# Patient Record
Sex: Female | Born: 1990 | Race: White | Hispanic: No | Smoking: Never smoker
Health system: Southern US, Community
[De-identification: ages and names within clinical notes are randomized; demographics above are authoritative.]

## PROBLEM LIST (undated history)

## (undated) DIAGNOSIS — J4530 Mild persistent asthma, uncomplicated: Secondary | ICD-10-CM

## (undated) DIAGNOSIS — F909 Attention-deficit hyperactivity disorder, unspecified type: Secondary | ICD-10-CM

## (undated) DIAGNOSIS — I4510 Unspecified right bundle-branch block: Secondary | ICD-10-CM

## (undated) HISTORY — PX: CHEST TUBE INSERTION: SHX231

## (undated) HISTORY — PX: WRIST SURGERY: SHX841

## (undated) HISTORY — DX: Attention-deficit hyperactivity disorder, unspecified type: F90.9

## (undated) HISTORY — DX: Mild persistent asthma, uncomplicated: J45.30

---
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
## 2011-03-21 ENCOUNTER — Emergency Department (HOSPITAL_COMMUNITY): Payer: BC Managed Care – PPO

## 2011-03-21 ENCOUNTER — Emergency Department (HOSPITAL_COMMUNITY)
Admission: EM | Admit: 2011-03-21 | Discharge: 2011-03-21 | Disposition: A | Discharge status: Home / Self Care | Payer: BC Managed Care – PPO | Attending: Emergency Medicine | Admitting: Emergency Medicine

## 2011-03-21 DIAGNOSIS — R0602 Shortness of breath: Secondary | ICD-10-CM | POA: Insufficient documentation

## 2011-03-21 DIAGNOSIS — R079 Chest pain, unspecified: Secondary | ICD-10-CM | POA: Insufficient documentation

## 2011-03-21 DIAGNOSIS — J9383 Other pneumothorax: Secondary | ICD-10-CM | POA: Insufficient documentation

## 2011-04-16 ENCOUNTER — Encounter: Payer: Self-pay | Admitting: Pulmonary Disease

## 2011-04-17 ENCOUNTER — Telehealth: Payer: Self-pay | Admitting: Pulmonary Disease

## 2011-04-17 ENCOUNTER — Ambulatory Visit (INDEPENDENT_AMBULATORY_CARE_PROVIDER_SITE_OTHER)
Admission: RE | Admit: 2011-04-17 | Discharge: 2011-04-17 | Disposition: A | Discharge status: Home / Self Care | Payer: BC Managed Care – PPO | Source: Ambulatory Visit | Attending: Pulmonary Disease | Admitting: Pulmonary Disease

## 2011-04-17 ENCOUNTER — Ambulatory Visit (INDEPENDENT_AMBULATORY_CARE_PROVIDER_SITE_OTHER): Payer: BC Managed Care – PPO | Admitting: Pulmonary Disease

## 2011-04-17 ENCOUNTER — Encounter: Payer: Self-pay | Admitting: Pulmonary Disease

## 2011-04-17 DIAGNOSIS — J9383 Other pneumothorax: Secondary | ICD-10-CM | POA: Insufficient documentation

## 2011-04-17 NOTE — Patient Instructions (Signed)
Will check scan of chest looking for blebs and other abnormalities. Will call to schedule bloodwork and breathing tests on a Friday afternoon.

## 2011-04-17 NOTE — Assessment & Plan Note (Signed)
The patient has had a recent pneumothorax, and I suspect her initial right-sided chest pain was related as well.  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  It raises the question whether her asthma is adequately controlled.  Also need to consider whether she may have an alpha one antitrypsin deficiency, or another unusual lung disease such as eosinophilic granuloma or lymphangioleiomyomatosis.  I would like to schedule for a CT of her chest, full PFTs, and check her alpha one level.  We'll then be able to give her some idea as to why this occurred, and possibly her risk for further events.

## 2011-04-17 NOTE — Progress Notes (Signed)
  Subjective:    Patient ID: Alison Young, female    DOB: 1991-01-20, 20 y.o.   MRN: [id number]  HPI The patient is an 20 year old female who I've been asked to see for recurrent pneumothorax.  She has a known history of asthma, and approximately 10 weeks ago had the sudden onset of right-sided chest and shoulder pain.  She presented to an urgent care, and a chest x-ray there was "clear".  Her pain resolved after 24 hours, and approximate 5 weeks later while at school at [employer], she had another episode of sudden onset of right-sided chest pain.  She presented to a local hospital, where she was found to have a right pneumothorax.  A small chest tube was placed, with resolution.  Her tube was removed the end of August.  The patient has a history of lifelong asthma, and in the past has been on Advair and other inhalers.  She admits to medical noncompliance, and most recently has been on Singulair with as needed albuterol.  She also takes allergy vaccine.  The patient does not have asthma issues unless she gets sick, and tells me that she rarely uses her rescue inhaler.  There is no history of premature lung disease in her family.  The patient denies any activities which will increase intrathoracic pressure, and has not done anything that results in Valsalva.   Review of Systems  Constitutional: Negative for fever and unexpected weight change.  HENT: Negative for ear pain, nosebleeds, congestion, sore throat, rhinorrhea, sneezing, trouble swallowing, dental problem, postnasal drip and sinus pressure.   Eyes: Negative for redness and itching.  Respiratory: Positive for shortness of breath. Negative for cough, chest tightness and wheezing.   Cardiovascular: Positive for chest pain. Negative for palpitations and leg swelling.  Gastrointestinal: Negative for nausea and vomiting.  Genitourinary: Negative for dysuria.  Musculoskeletal: Negative for joint swelling.  Skin: Negative for rash.    Neurological: Negative for headaches.  Hematological: Does not bruise/bleed easily.  Psychiatric/Behavioral: Negative for dysphoric mood. The patient is not nervous/anxious.        Objective:   Physical Exam Constitutional:  Well developed, no acute distress  HENT:  Nares patent without discharge  Oropharynx without exudate, palate and uvula are normal  Eyes:  Perrla, eomi, no scleral icterus  Neck:  No JVD, no TMG  Cardiovascular:  Normal rate, regular rhythm, no rubs or gallops.  No murmurs        Intact distal pulses  Pulmonary :  Normal breath sounds, no stridor or respiratory distress   No rales, rhonchi, or wheezing  Abdominal:  Soft, nondistended, bowel sounds present.  No tenderness noted.   Musculoskeletal:  No lower extremity edema noted.  Lymph Nodes:  No cervical lymphadenopathy noted  Skin:  No cyanosis noted  Neurologic:  Alert, appropriate, moves all 4 extremities without obvious deficit.         Assessment & Plan:

## 2011-04-17 NOTE — Telephone Encounter (Signed)
Alison Young we need to call this pt and find out what Friday afternoon she can come into town and have full pfts and AAT Phenotyping. Thanks.

## 2011-04-20 NOTE — Telephone Encounter (Signed)
Does she need ov with you same day as the PFT?

## 2011-04-20 NOTE — Telephone Encounter (Signed)
She does not.  Thanks.  I will call her. Make sure she gets the AAT PHENOTYPING as well. Thanks.

## 2011-04-21 NOTE — Telephone Encounter (Signed)
Pt's mother, Harriett Sine,  returned Megan's call & can be reached at [phone].  Pt's Fall break is from 13OCT-20OCT. Antionette Fairy

## 2011-04-21 NOTE — Telephone Encounter (Signed)
[id number] 

## 2011-04-22 NOTE — Telephone Encounter (Signed)
PFT SCHEDULED- PT'S MOM WAS GIVEN VERBAL INSTRUCTIONS TO PASS ALONG TO PT RE: PFT. ALSO ADVISED TO ARRIVE IN OUR OFFICE AT LEAST 15 MINS. PRIOR - PER MEGAN LABS WILL BE PUT IN. Alison Young

## 2011-04-22 NOTE — Telephone Encounter (Signed)
[id number] 

## 2011-04-22 NOTE — Telephone Encounter (Signed)
Pt scheduled for 10/15 at 4 pm.  Also put in labs for that day as well.

## 2011-05-04 ENCOUNTER — Other Ambulatory Visit: Payer: BC Managed Care – PPO

## 2011-05-04 ENCOUNTER — Ambulatory Visit (INDEPENDENT_AMBULATORY_CARE_PROVIDER_SITE_OTHER): Payer: BC Managed Care – PPO | Admitting: Pulmonary Disease

## 2011-05-04 DIAGNOSIS — J9383 Other pneumothorax: Secondary | ICD-10-CM

## 2011-05-04 LAB — PULMONARY FUNCTION TEST

## 2011-05-04 NOTE — Progress Notes (Signed)
PFT done today. 

## 2011-05-08 ENCOUNTER — Telehealth: Payer: Self-pay | Admitting: Pulmonary Disease

## 2011-05-08 NOTE — Telephone Encounter (Signed)
Pt requesting results on PFT and labs. Also needs the CD's that she forgot at last OV 04-17-11. Pt is aware KC and Aundra Millet R are out of the office and if she has not heard back by mid Monday morning to give [place] a return call. Pt is leaving for school on Sunday and mother will have to pick up same. Megan please advise. Thanks.

## 2011-05-09 LAB — ALPHA-1 ANTITRYPSIN PHENOTYPE

## 2011-05-11 NOTE — Telephone Encounter (Signed)
Called and spoke with pt.  Informed her KC out of office this week and will return next week.  Informed pt I will check with KC once he gets back to see if results of PFT and A1AT are back yet.  Pt verbalized understanding and stated she was ok with this.

## 2011-05-11 NOTE — Telephone Encounter (Signed)
Pt's mother called & stated pt is back in school.   Requesting results from testing completed & requesting discs pt gave to [facility] be returned.  Antionette Fairy    Pt's mother can be reached at [phone] or at [phone].  Antionette Fairy

## 2011-05-11 NOTE — Telephone Encounter (Signed)
Spoke with pt's mother and notified that [facility] out of the office this wk and this will be addressed next wk when he returns. She verbalized understanding.

## 2011-05-18 NOTE — Telephone Encounter (Signed)
Discussed all test results with pt's mother.  Answered all of her questions.  Pt needs yearly spirometry if she stays on singulair alone.  Can f/u with me when she gets home from school in the summers.

## 2011-05-20 ENCOUNTER — Encounter: Payer: Self-pay | Admitting: Pulmonary Disease

## 2011-08-17 ENCOUNTER — Telehealth: Payer: Self-pay | Admitting: Pulmonary Disease

## 2011-08-17 NOTE — Telephone Encounter (Signed)
Spoke with Harriett Sine, pt's mother. She states that the pt is having "lung pain" just like before when her lung had collapsed. She states pt is at school in the [place] so unable to come here for ov. I advised that the pt will need to go to ED closest to her asap for eval. Harriett Sine verbalized understanding and will inform the pt.

## 2011-10-30 ENCOUNTER — Ambulatory Visit: Payer: BC Managed Care – PPO

## 2012-02-15 ENCOUNTER — Encounter: Payer: Self-pay | Admitting: Internal Medicine

## 2012-02-15 ENCOUNTER — Telehealth: Payer: Self-pay | Admitting: Internal Medicine

## 2012-02-15 ENCOUNTER — Ambulatory Visit (INDEPENDENT_AMBULATORY_CARE_PROVIDER_SITE_OTHER)
Admission: RE | Admit: 2012-02-15 | Discharge: 2012-02-15 | Disposition: A | Discharge status: Home / Self Care | Payer: BC Managed Care – PPO | Source: Ambulatory Visit | Attending: Internal Medicine | Admitting: Internal Medicine

## 2012-02-15 ENCOUNTER — Ambulatory Visit (INDEPENDENT_AMBULATORY_CARE_PROVIDER_SITE_OTHER): Payer: BC Managed Care – PPO | Admitting: Internal Medicine

## 2012-02-15 VITALS — BP 110/58 | HR 100 | Ht 66.5 in | Wt 124.0 lb

## 2012-02-15 DIAGNOSIS — Z8709 Personal history of other diseases of the respiratory system: Secondary | ICD-10-CM

## 2012-02-15 DIAGNOSIS — R0789 Other chest pain: Secondary | ICD-10-CM

## 2012-02-15 DIAGNOSIS — J9383 Other pneumothorax: Secondary | ICD-10-CM

## 2012-02-15 DIAGNOSIS — J45909 Unspecified asthma, uncomplicated: Secondary | ICD-10-CM

## 2012-02-15 NOTE — Progress Notes (Signed)
Subjective:    Patient ID: Alison Young, female    DOB: 1990-07-23, 21 y.o.   MRN: [id number]  HPI 04/17/11- Dr Shelle Iron The patient is an 21 year old female who I've been asked to see for recurrent pneumothorax.  She has a known history of asthma, and approximately 10 weeks ago had the sudden onset of right-sided chest and shoulder pain.  She presented to an urgent care, and a chest x-ray there was "clear".  Her pain resolved after 24 hours, and approximate 5 weeks later while at school at [employer], she had another episode of sudden onset of right-sided chest pain.  She presented to a local hospital, where she was found to have a right pneumothorax.  A small chest tube was placed, with resolution.  Her tube was removed the end of August.  The patient has a history of lifelong asthma, and in the past has been on Advair and other inhalers.  She admits to medical noncompliance, and most recently has been on Singulair with as needed albuterol.  She also takes allergy vaccine.  The patient does not have asthma issues unless she gets sick, and tells me that she rarely uses her rescue inhaler.  There is no history of premature lung disease in her family.  The patient denies any activities which will increase intrathoracic pressure, and has not done anything that results in Valsalva.  02/15/12- 38 yo F never smoker w/ hx recurrent PTX,, chronic asthma last seen by Dr Shelle Iron 04/17/11- Acute visit, here with mother. [place] Patient[place] started having SOB and fast heartrate-sat down for about 1 hour and then decided to wakeboard and started having pain in right side of chest-was unable to get deep breathe, fatigued, palpitations, nearly fainted, and weak (had collapsed lung back in August 2011 and again 2012/ chest tube. Describes current episode as sore upper anterior chest and mid anterior chest with pressure. She rested a few hours and then tried to wake board-pulling tow rope  but not diving. Sharp pain through left parasternal area to back lasted through the afternoon. Pain still present but less today. No diaphoresis, nausea, vomiting, dysphagia, headache, cough or wheeze. She did not seek medical attention until now. She restarted Dulera last week after seeing her allergist/Dr. Jordan Callas. Has not used rescue inhaler recently. CT chest 04/17/2011 was negative for blebs or parenchymal disease. Aunt has immotile cilia syndrome and bronchiectasis.  ROS-see HPI Constitutional:   No-   weight loss, night sweats, fevers, chills, fatigue, lassitude. HEENT:   No-  headaches, difficulty swallowing, tooth/dental problems, sore throat,       No-  sneezing, itching, ear ache, nasal congestion, post nasal drip,  CV:  + per HPI-  chest pain, No-orthopnea, PND, swelling in lower extremities, anasarca,  dizziness, palpitations Resp: + Pleuritic shortness of breath with exertion or at rest.              No-   productive cough,  No non-productive cough,  No- coughing up of blood.              No-   change in color of mucus.  No- wheezing.   Skin: No-   rash or lesions. GI:  No-   heartburn, indigestion, abdominal pain, nausea, vomiting, diarrhea,                 change in bowel habits, loss of appetite GU:  MS:  No-   joint pain or swelling.  Neuro-     nothing unusual Psych:  No- change in mood or affect. No depression or anxiety.  No memory loss.  OBJ- Physical Exam General- Alert, Oriented, Affect-appropriate, Distress- none acute; casual, comfortable appearing, apparently well [name] woman. Skin- rash-none, lesions- none, excoriation- none. No crepitus Lymphadenopathy- none Head- atraumatic            Eyes- Gross vision intact, PERRLA, conjunctivae and secretions clear            Ears- Hearing, canals-normal            Nose- Clear, no-Septal dev, mucus, polyps, erosion, perforation             Throat- Mallampati II , mucosa clear , drainage- none, tonsils- atrophic Neck-  flexible , trachea midline, no stridor , thyroid nl, carotid no bruit Chest - symmetrical excursion , unlabored           Heart/CV- RRR , no murmur , no gallop  , no rub, nl s1 s2                           - JVD- none , edema- none, stasis changes- none, varices- none           Lung- clear to P&A, wheeze- none, cough- none , dullness-none, rub- none           Chest wall- uncertain/minimal tenderness to pressure right upper anterior and right midaxillary line Abd- tender-no, distended-no, bowel sounds-present, HSM- no Br/ Gen/ Rectal- Not done, not indicated Extrem- cyanosis- none, clubbing, none, atrophy- none, strength- nl Neuro- grossly intact to observation       Assessment & Plan:

## 2012-02-15 NOTE — Telephone Encounter (Signed)
I reached her on her cell. CXR -no PTX, no explanation for chest pain.

## 2012-02-15 NOTE — Patient Instructions (Addendum)
Order- CXR atypical chest pain, asthma, hx pneumothorax  Screen for alpha 1 antitrypsin deficiency

## 2012-02-20 NOTE — Assessment & Plan Note (Signed)
I explained concern she did not seek attention for this pain and explained the potential seriousness if she were really having a recurrent pneumothorax. If no apparent pneumothorax on chest x-ray now, since she is markedly improved, there may not be more to do. Differential diagnosis would include pulled chest wall muscle or cracked rib, pneumothorax versus pneumomediastinum. Exam now is really unimpressive. Previous CT did not show blebs. We will have her return to Dr. Shelle Iron.

## 2012-02-21 ENCOUNTER — Encounter: Payer: Self-pay | Admitting: Pulmonary Disease

## 2012-03-14 ENCOUNTER — Encounter: Payer: Self-pay | Admitting: Internal Medicine

## 2012-03-16 ENCOUNTER — Encounter: Payer: Self-pay | Admitting: Internal Medicine

## 2012-03-18 ENCOUNTER — Ambulatory Visit: Payer: BC Managed Care – PPO | Admitting: Pulmonary Disease

## 2012-03-25 ENCOUNTER — Ambulatory Visit (INDEPENDENT_AMBULATORY_CARE_PROVIDER_SITE_OTHER): Payer: BC Managed Care – PPO | Admitting: Pulmonary Disease

## 2012-03-25 ENCOUNTER — Encounter: Payer: Self-pay | Admitting: Pulmonary Disease

## 2012-03-25 VITALS — BP 100/60 | HR 88 | Temp 98.3°F | Ht 66.5 in | Wt 123.0 lb

## 2012-03-25 DIAGNOSIS — J9383 Other pneumothorax: Secondary | ICD-10-CM

## 2012-03-25 NOTE — Patient Instructions (Addendum)
Continue on current meds. followup with me in one year, but please call if new symptoms.

## 2012-03-25 NOTE — Assessment & Plan Note (Signed)
The pt has not had further issues with chest pain since July.  Her cxr at that time did not show PTX.  She is being treated aggressively for her asthma and allergies by Dr. Barnhill Callas, and her medical compliance is very important to reduce her risk of recurrent ptx.  I have asked her to f/u with me in one year, but to call us if any issue with chest pain.

## 2012-03-25 NOTE — Progress Notes (Signed)
  Subjective:    Patient ID: Alison Young, female    DOB: 1990/08/30, 21 y.o.   MRN: [id number]  HPI The patient comes in today for followup of her history of pneumothorax.  She has been evaluated with a normal CT chest, and also has a normal alpha one antitrypsin level.  She has been doing well, but did have an episode of chest pain in July of this year that turned out to be unrelated to pneumothorax.  She has had no further chest discomfort since that time.  She also has a history of asthma, and was unable to stay controlled on Singulair alone.  This is being followed by her allergist, and she is now on dulera as well as other treatments for allergic rhinitis.   Review of Systems  Constitutional: Negative.  Negative for fever and unexpected weight change.  HENT: Positive for congestion, sore throat, sneezing, postnasal drip and sinus pressure. Negative for ear pain, nosebleeds, trouble swallowing and dental problem.   Eyes: Negative.  Negative for redness and itching.  Respiratory: Negative.  Negative for cough, chest tightness, shortness of breath and wheezing.   Cardiovascular: Negative.  Negative for palpitations and leg swelling.  Gastrointestinal: Negative.  Negative for nausea and vomiting.  Genitourinary: Negative.  Negative for dysuria.  Musculoskeletal: Negative.  Negative for joint swelling.  Skin: Negative.  Negative for rash.  Neurological: Negative.  Negative for headaches.  Hematological: Negative.  Does not bruise/bleed easily.  Psychiatric/Behavioral: Negative.  Negative for dysphoric mood. The patient is not nervous/anxious.        Objective:   Physical Exam Well-developed female in no acute distress Nose without purulence or discharge noted Chest totally clear to auscultation Cardiac exam with regular rate and rhythm Lower extremities without edema, no cyanosis Alert and oriented, moves all 4 extremities.       Assessment & Plan:

## 2012-07-26 ENCOUNTER — Emergency Department (HOSPITAL_COMMUNITY)
Admission: EM | Admit: 2012-07-26 | Discharge: 2012-07-27 | Disposition: A | Discharge status: Home / Self Care | Payer: BC Managed Care – PPO | Attending: Emergency Medicine | Admitting: Emergency Medicine

## 2012-07-26 ENCOUNTER — Telehealth: Payer: Self-pay | Admitting: Critical Care Medicine

## 2012-07-26 ENCOUNTER — Emergency Department (HOSPITAL_COMMUNITY): Payer: BC Managed Care – PPO

## 2012-07-26 ENCOUNTER — Encounter (HOSPITAL_COMMUNITY): Payer: Self-pay

## 2012-07-26 DIAGNOSIS — M549 Dorsalgia, unspecified: Secondary | ICD-10-CM | POA: Insufficient documentation

## 2012-07-26 DIAGNOSIS — J939 Pneumothorax, unspecified: Secondary | ICD-10-CM

## 2012-07-26 DIAGNOSIS — Z7982 Long term (current) use of aspirin: Secondary | ICD-10-CM | POA: Insufficient documentation

## 2012-07-26 DIAGNOSIS — Z8709 Personal history of other diseases of the respiratory system: Secondary | ICD-10-CM | POA: Insufficient documentation

## 2012-07-26 DIAGNOSIS — Z79899 Other long term (current) drug therapy: Secondary | ICD-10-CM | POA: Insufficient documentation

## 2012-07-26 DIAGNOSIS — J45909 Unspecified asthma, uncomplicated: Secondary | ICD-10-CM | POA: Insufficient documentation

## 2012-07-26 DIAGNOSIS — R0602 Shortness of breath: Secondary | ICD-10-CM | POA: Insufficient documentation

## 2012-07-26 DIAGNOSIS — J9383 Other pneumothorax: Secondary | ICD-10-CM | POA: Insufficient documentation

## 2012-07-26 LAB — PREGNANCY, URINE: Preg Test, Ur: NEGATIVE

## 2012-07-26 MED ORDER — MORPHINE SULFATE 4 MG/ML IJ SOLN
2.0000 mg | Freq: Once | INTRAMUSCULAR | Status: AC
  Administered 2012-07-26: 2 mg via INTRAVENOUS
  Filled 2012-07-26: qty 1

## 2012-07-26 MED ORDER — OXYCODONE-ACETAMINOPHEN 5-325 MG PO TABS: 1.0000 | ORAL_TABLET | Freq: Four times a day (QID) | ORAL | Status: DC | PRN

## 2012-07-26 NOTE — ED Provider Notes (Signed)
History     CSN: [id number]  Arrival date & time 07/26/12  1849   First MD Initiated Contact with Patient 07/26/12 1929      Chief Complaint  Patient presents with  . Shortness of Breath  . Chest Pain  . Back Pain    (Consider location/radiation/quality/duration/timing/severity/associated sxs/prior treatment) HPI Comments: [name] presents with her mother for evaluation.  She reports participating in a Crossfit class this afternoon.  She experienced sudden onset right-sided chest pain and shortness of breath.  She denies any falls, trauma, or chest wall injury.  She is concerned her lung may have collapsed again.  Patient is a 22 y.o. female presenting with shortness of breath, chest pain, and back pain. The history is provided by the patient. No language interpreter was used.  Shortness of Breath  The current episode started today. The problem occurs continuously. The problem has been unchanged. The problem is moderate. The symptoms are relieved by rest. Exacerbated by: deep breaths. Associated symptoms include chest pain and shortness of breath. Pertinent negatives include no chest pressure, no fever, no rhinorrhea, no stridor, no cough and no wheezing. There was no intake of a foreign body. The intake occurred while playing (while exercising). She was not exposed to toxic fumes. She has not inhaled smoke recently. She is currently using steroids. She has had prior hospitalizations. She has had no prior ICU admissions. She has had no prior intubations. Her past medical history is significant for asthma. Past medical history comments: pneumothorax, spontaneous. She has been behaving normally. Urine output has been normal. The last void occurred less than 6 hours ago. There were no sick contacts. She has received no recent medical care.  Chest Pain Primary symptoms include shortness of breath. Pertinent negatives for primary symptoms include no fever, no cough and no wheezing.  The  patient's medical history is significant for asthma. Past medical history comments: pneumothorax, spontaneous    Back Pain  Associated symptoms include chest pain. Pertinent negatives include no fever.    Past Medical History  Diagnosis Date  . Asthma   . Pneumothorax   . Allergic rhinitis     Past Surgical History  Procedure Date  . Wrist surgery     Left  . Chest tube insertion     Family History  Problem Relation Age of Onset  . Allergies Mother   . Allergies Father   . Allergies Sister   . Asthma Mother   . Asthma Sister   . Heart disease Mother     valve repair  . Breast cancer Maternal Grandmother   . Cervical cancer Maternal Grandmother     History  Substance Use Topics  . Smoking status: Never Smoker   . Smokeless tobacco: Never Used  . Alcohol Use: No    OB History    Grav Para Term Preterm Abortions TAB SAB Ect Mult Living                  Review of Systems  Constitutional: Negative for fever.  HENT: Negative for rhinorrhea.   Respiratory: Positive for shortness of breath. Negative for cough, wheezing and stridor.   Cardiovascular: Positive for chest pain.  Musculoskeletal: Positive for back pain.    Allergies  Review of patient's allergies indicates no known allergies.  Home Medications   Current Outpatient Rx  Name  Route  Sig  Dispense  Refill  . ALBUTEROL SULFATE HFA 108 (90 BASE) MCG/ACT IN AERS   Inhalation  Inhale 2 puffs into the lungs every 6 (six) hours as needed. SHORTNESS OF BREATH         . ASPIRIN-ACETAMINOPHEN-CAFFEINE 250-250-65 MG PO TABS   Oral   Take 1 tablet by mouth every 6 (six) hours as needed. HEADACHE         . LEVOCETIRIZINE DIHYDROCHLORIDE 5 MG PO TABS   Oral   Take 5 mg by mouth daily.           . MOMETASONE FUROATE 50 MCG/ACT NA SUSP   Nasal   Place 2 sprays into the nose daily.         . MOMETASONE FURO-FORMOTEROL FUM 100-5 MCG/ACT IN AERO   Inhalation   Inhale 2 puffs into the lungs 2  (two) times daily.         Marland Kitchen MONTELUKAST SODIUM 10 MG PO TABS   Oral   Take 10 mg by mouth daily.           . NON FORMULARY   Oral   Take 1 tablet by mouth daily. [employer]  Take as directed.           BP 119/76  Pulse 82  Temp 98 F (36.7 C)  Resp 20  SpO2 99%  LMP 07/26/2012  Physical Exam  Nursing note and vitals reviewed. Constitutional: She is oriented to person, place, and time. She appears well-developed and well-nourished. No distress.  HENT:  Head: Normocephalic and atraumatic.  Right Ear: External ear normal.  Left Ear: External ear normal.  Mouth/Throat: Oropharynx is clear and moist. No oropharyngeal exudate.  Eyes: Conjunctivae normal are normal. Pupils are equal, round, and reactive to light. Right eye exhibits no discharge. Left eye exhibits no discharge. No scleral icterus.  Neck: Normal range of motion. Neck supple. No JVD present. No tracheal deviation present.  Cardiovascular: Normal rate, regular rhythm, normal heart sounds and intact distal pulses.  Exam reveals no gallop and no friction rub.   No murmur heard. Pulmonary/Chest: Effort normal and breath sounds normal. No stridor. No respiratory distress. She has no wheezes. She has no rales. She exhibits no tenderness.  Abdominal: Soft. Bowel sounds are normal. She exhibits no distension and no mass. There is no tenderness. There is no rebound and no guarding.  Musculoskeletal: Normal range of motion. She exhibits no edema and no tenderness.  Lymphadenopathy:    She has no cervical adenopathy.  Neurological: She is alert and oriented to person, place, and time. No cranial nerve deficit.  Skin: Skin is warm and dry. No rash noted. She is not diaphoretic. No erythema. No pallor.  Psychiatric: She has a normal mood and affect. Her behavior is normal.    ED Course  Procedures (including critical care time)   Labs Reviewed  PREGNANCY, URINE   Dg Chest 2 View  07/26/2012  *RADIOLOGY  REPORT*  Clinical Data: 22 year old female with shortness of breath and chest pain.  CHEST - 2 VIEW  Comparison: 02/15/2012 and prior chest radiographs dating back to 2009.  Findings: A small (5-10%) right apical pneumothorax is noted. The cardiomediastinal silhouette is unremarkable. The lungs are clear. There is no evidence of pulmonary edema, pleural effusion, airspace disease or pulmonary mass. No bony abnormalities are identified.  IMPRESSION: Small (5-10%) right apical pneumothorax.  Critical Value/emergent results were called by telephone at the time of interpretation on 07/26/2012 at 7:40 p.m. to Dr. Lorenso Courier, who verbally acknowledged these results.   Original Report Authenticated By: Harmon Pier, M.D.  No diagnosis found.   Date: 07/26/2012  Rate: 105 bpm  Rhythm: sinus tachycardia  QRS Axis: normal  Intervals: normal  ST/T Wave abnormalities: normal  Conduction Disutrbances:none  Narrative Interpretation:   Old EKG Reviewed: none available      MDM  Pt presents for evaluation of sudden onset CP and SOB.  She has a hx of a spontaneous pneumothorax.  She appears nontoxic, NAD.  Note nl respiratory effort.  Her breath sounds are symmetric.  The CXR performed during the triage process demonstrates a small right apical pneumothorax.  Discussed the x-ray with Dr. Si Gaul (radiologist).  He notes it to be 5-10% total volume.  Placed oxygen via [place] and a consult with the trauma surgery service.  As this is such a small ptx, will manage noninvasively.  Anticipate observation admission.  2348.  Pt stable, NAD.  Repeat CXR unchanged.  There has been no expansion of the PTX.  Pt appears comfortable.  Discussed her evaluation with the on-call provider for CT surgery.  She will follow-up with him closely.  Discussed indications for immediate return to the ER.  She and her parents demonstrate clear understanding.     Tobin Chad, MD 07/26/12 2350

## 2012-07-26 NOTE — ED Notes (Signed)
Pt states was doing "crossfit stuff" and started having shortness of breathing, weakness, and sharp pains in R chest radiating down to R side, states had a collapsed lung in 2012 and this felt the same way. Pt in no respiratory distress, 100% on RA.

## 2012-07-26 NOTE — Telephone Encounter (Signed)
Pt thinks she is having a pneumothorax again. She was told to go to the nearest ED for evaluation.

## 2012-07-26 NOTE — ED Notes (Signed)
Patient reports that she is having SOB, CP and right back pain that began today. Patient has a history of spontaneous collapsed lung 02/2011 with a chest tube.

## 2012-07-26 NOTE — ED Notes (Signed)
Patient transported to X-ray 

## 2012-07-26 NOTE — [facility] Counselor (Signed)
*  07/26/12 [name] requested physician notes. Info sent this am and this writer was told that medical team would need to review. Once reviewed this writer was told that their staff would call back regarding patients disposition.   Called back this evening to follow up with with disposition. This Clinical research associate was told that paperwork was not received after it was confirmed earlier today by [name] that paperwork was received.  [facility] is also requesting more information including the initial H&P and more physicians notes. Although all  these documents were already faxed; [facility] continues to ask for these same documents again and again. Writer will fax these documents prior to shift ending again.   [facility] is also requesting a documented reason as to why patient keeps falling here in the ED. Writer notified EDP-Dr. Lorenso Courier of [facility]'s request. Dr. Lorenso Courier stated that he has other important priorities at this time and "I will not be dealing with that". Dr. Lorenso Courier explains that he will have to look through the patients entire chart and he is unable to do so at this time. He will past this task to the morning EDP to complete. Writer will ask on-coming [facility] staff to follow up with the morning EDP in regards to documenting, "Why patient has issues with falls".  Consulted with case manager Kim regarding patients disposition and the difficulty related to getting patient placed on wait list. Selena Batten will contact the medical director for assistance in this matter.

## 2012-08-02 ENCOUNTER — Other Ambulatory Visit: Payer: Self-pay

## 2012-08-02 ENCOUNTER — Other Ambulatory Visit: Payer: Self-pay | Admitting: Cardiothoracic Surgery

## 2012-08-02 ENCOUNTER — Ambulatory Visit (INDEPENDENT_AMBULATORY_CARE_PROVIDER_SITE_OTHER): Payer: BC Managed Care – PPO | Admitting: Cardiothoracic Surgery

## 2012-08-02 ENCOUNTER — Encounter: Payer: Self-pay | Admitting: Cardiothoracic Surgery

## 2012-08-02 VITALS — BP 109/73 | HR 118 | Resp 18 | Ht 66.5 in | Wt 123.0 lb

## 2012-08-02 DIAGNOSIS — J9383 Other pneumothorax: Secondary | ICD-10-CM

## 2012-08-02 LAB — CBC
HCT: 38.3 % (ref 36.0–46.0)
Hemoglobin: 13.1 g/dL (ref 12.0–15.0)
MCH: 28.8 pg (ref 26.0–34.0)
MCHC: 34.2 g/dL (ref 30.0–36.0)
MCV: 84.2 fL (ref 78.0–100.0)
Platelets: 226 10*3/uL (ref 150–400)
RBC: 4.55 MIL/uL (ref 3.87–5.11)
RDW: 13.7 % (ref 11.5–15.5)
WBC: 4.8 10*3/uL (ref 4.0–10.5)

## 2012-08-02 NOTE — Progress Notes (Signed)
PCP is Provider Not In System Referring Provider is Powers, Jules Husbands, MD  Chief Complaint  Patient presents with  . Spontaneous Pneumothorax    F/U from ED visiit    HPI: 22 year old with history of right spontaneous pneumothorax. The last evaluated at the emergency department 07/26/2012 at which time she had a right 5-10% apical pneumothorax.. 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 After that episode she was evaluated by Dr. clamps who had a chest CT performed which was unremarkable. Alpha 1 anti trypsin level was normal. She has a history of asthma as child  and is followed by an allergist on Singulair and dulera inhaler. Her last episode of chest pain and apical pneumothorax associated with cross training exercises. She probably has had 4 episodes of vomiting his pneumothorax since 2012 one requiring a chest tube. She feels that probably 2 of the episodes were temporally related to her menstrual cycle. There is no family history of spontaneous pneumothorax. There's no history of endometriosis.  She is currently a Consulting civil engineer at [employer]. She is a nonsmoker. She denies any significant thoracic trauma. CT scan of the chest year and half ago shows no structural abnormalities. Related symptoms including some dizziness palpitations and some presyncope.   Past Medical History  Diagnosis Date  . Asthma   . Pneumothorax   . Allergic rhinitis     Past Surgical History  Procedure Date  . Wrist surgery     Left  . Chest tube insertion    right chest pigtail catheter placed at [facility] while she was in undergraduate at [employer] 2012  Family History  Problem Relation Age of Onset  . Allergies Mother   . Allergies Father   . Allergies Sister   . Asthma Mother   . Asthma  Sister   . Heart disease Mother     valve repair  . Breast cancer Maternal Grandmother   . Cervical cancer Maternal Grandmother     Social History History  Substance Use Topics  . Smoking status: Never Smoker   . Smokeless tobacco: Never Used  . Alcohol Use: No    Current Outpatient Prescriptions  Medication Sig Dispense Refill  . albuterol (PROVENTIL HFA;VENTOLIN HFA) 108 (90 BASE) MCG/ACT inhaler Inhale 2 puffs into the lungs every 6 (six) hours as needed. SHORTNESS OF BREATH      . aspirin-acetaminophen-caffeine (EXCEDRIN MIGRAINE) 250-250-65 MG per tablet Take 1 tablet by mouth every 6 (six) hours as needed. HEADACHE      . levocetirizine (XYZAL) 5 MG tablet Take 5 mg by mouth daily.        . mometasone (NASONEX) 50 MCG/ACT nasal spray Place 2 sprays into the nose daily.      . mometasone-formoterol (DULERA) 100-5 MCG/ACT AERO Inhale 2 puffs into the lungs 2 (two) times daily.      . montelukast (SINGULAIR) 10 MG tablet Take 10 mg by mouth daily.        . NON FORMULARY Take 1 tablet by mouth daily. [employer]  Take as directed.      Marland Kitchen oxyCODONE-acetaminophen (PERCOCET) 5-325 MG per tablet Take 1 tablet by mouth every 6 (six) hours as needed for pain.  14 tablet  0    No Known Allergies  Review of Systems no recent symptoms  of flu no cardiac history EKG shows sinus tachycardia  BP 109/73  Pulse 118  Resp 18  Ht 5' 6.5" (1.689 m)  Wt 123 lb (55.792 kg)  BMI 19.56 kg/m2  SpO2 97%  LMP 07/26/2012 Physical Exam Alert and pleasant Breath sounds clear and equal Neck without JVD crepitus or adenopathy Heart rate rapid but no murmur or gallop Extremities warm well perfused normal pulses Neuro intact  Diagnostic Tests: Chest x-ray from January 7 reviewed showing 5-10% apical pneumothorax on right  Impression: Spontaneous pneumothorax most likely related to her aesthetic body habitus. Recommend holding all vigorous training, lifting, cross training for 6  weeks. Symptoms recur she will contact the office or report to  emergency department for x-ray. I discussed the possibility of the patient needing right VATS surgery for spontaneous pneumothorax with her parents if. she has a significant recurrence in the range of 20%.  Plan: Return in 6 weeks for followup. Check CBC to make sure his she is not anemic with her orthostatic symptoms

## 2012-08-10 ENCOUNTER — Ambulatory Visit (INDEPENDENT_AMBULATORY_CARE_PROVIDER_SITE_OTHER): Payer: BC Managed Care – PPO | Admitting: Physician Assistant

## 2012-08-10 VITALS — BP 99/61 | HR 77 | Temp 98.3°F | Resp 16 | Ht 66.58 in | Wt 122.2 lb

## 2012-08-10 DIAGNOSIS — Z309 Encounter for contraceptive management, unspecified: Secondary | ICD-10-CM

## 2012-08-10 DIAGNOSIS — J029 Acute pharyngitis, unspecified: Secondary | ICD-10-CM

## 2012-08-10 MED ORDER — FIRST-DUKES MOUTHWASH MT SUSP: 10.0000 mL | OROMUCOSAL | Status: DC | PRN

## 2012-08-10 MED ORDER — DROSPIRENONE-ETHINYL ESTRADIOL 3-0.03 MG PO TABS: 1.0000 | ORAL_TABLET | Freq: Every day | ORAL | Status: DC

## 2012-08-10 NOTE — Progress Notes (Signed)
Subjective:    Patient ID: Alison Young, female    DOB: 1991/02/04, 21 y.o.   MRN: [id number]  HPI   Alison Young is a 22 yr female here with concern for strep throat.  States her throat started hurting two nights ago.  She woke up "gagging" this morning because she felt like her throat was closing.  Denies cough, runny nose, or ear pain.  States "I always have a headache."  No GI symptoms, fever, or chills.  Not aware of any strep contacts.  Denies post-nasal drainage currently, but does state that she felt like she might have had some drainage Monday night when symptoms started.    Would also like a refill on Yasmin if possible.  She is between doctors currently as she aged out of her pediatrician and has recently moved home from college.  Has been on Yasmin for approx four years.  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  States Yasmin works well for controlling symptoms.  Not currently sexually active.     Review of Systems  Constitutional: Negative for fever and chills.  HENT: Positive for sore throat, trouble swallowing and postnasal drip. Negative for ear pain, congestion, rhinorrhea, drooling and sinus pressure.   Respiratory: Negative for cough, shortness of breath and wheezing.   Cardiovascular: Negative.   Gastrointestinal: Negative.   Musculoskeletal: Negative.   Neurological: Positive for headaches.       Objective:   Physical Exam  Vitals reviewed. Constitutional: She is oriented to person, place, and time. She appears well-developed and well-nourished. No distress.  HENT:  Head: Normocephalic and atraumatic. No trismus in the jaw.  Right Ear: Tympanic membrane and ear canal normal.  Left Ear: Tympanic membrane and ear canal normal.  Nose: Nose normal.  Mouth/Throat: Uvula is midline and mucous membranes are normal. No uvula swelling. Posterior oropharyngeal edema and posterior oropharyngeal erythema present. No oropharyngeal exudate or tonsillar abscesses.    Eyes: Conjunctivae normal are normal. No scleral icterus.  Neck: Neck supple.  Cardiovascular: Normal rate, regular rhythm and normal heart sounds.  Exam reveals no gallop and no friction rub.   No murmur heard. Pulmonary/Chest: Effort normal and breath sounds normal. She has no wheezes. She has no rales.  Lymphadenopathy:    She has cervical adenopathy.  Neurological: She is alert and oriented to person, place, and time.  Skin: Skin is warm and dry.  Psychiatric: She has a normal mood and affect. Her behavior is normal.     Filed Vitals:   08/10/12 1336  BP: 99/61  Pulse: 77  Temp: 98.3 F (36.8 C)  Resp: 16      Results for orders placed in visit on 08/10/12  POCT RAPID STREP A (OFFICE)      Component Value Range   Rapid Strep A Screen Negative  Negative       Assessment & Plan:   1. Pharyngitis  POCT rapid strep A, Culture, Group A Strep, Diphenhyd-Hydrocort-Nystatin (FIRST-DUKES MOUTHWASH) SUSP  2. Contraception management  drospirenone-ethinyl estradiol (YASMIN,ZARAH,SYEDA) 3-0.03 MG tablet    Alison Young is a very pleasant 22 yr old female with pharyngitis.  Rapid strep is negative.  She is afebrile.  Throat is erythematous and slightly swollen, but without exudate or abscess.  Throat culture sent.  Will treat symptoms with Magic Mouthwash and ibuprofen.  Push fluids.  Plenty of rest.  Will make changes based on culture data if necessary.  Discussed with pt and she is  in agreement with this plan.  I have refilled Yasmin as well.  Discussed with pt that we would be happy to see her either here or at 104 if she would like to establish with [place].

## 2012-08-10 NOTE — Patient Instructions (Addendum)
Your rapid strep test was negative today.  I am sending out a throat culture and will let you know what that shows and if we need to add any medicine.  For now, begin using Magic Mouthwash as frequently as every two hours.  You may also use ibuprofen 600mg  every 8 hours with food.  Plenty of fluids (water is best!) and rest.  Please let us know if you feel like you are getting worse or not improving.   Viral Pharyngitis Viral pharyngitis is a viral infection that produces redness, pain, and swelling (inflammation) of the throat. It can spread from person to person (contagious). CAUSES Viral pharyngitis is caused by inhaling a large amount of certain germs called viruses. Many different viruses cause viral pharyngitis. SYMPTOMS Symptoms of viral pharyngitis include:  Sore throat.  Tiredness.  Stuffy nose.  Low-grade fever.  Congestion.  Cough. TREATMENT Treatment includes rest, drinking plenty of fluids, and the use of over-the-counter medication (approved by your caregiver). HOME CARE INSTRUCTIONS   Drink enough fluids to keep your urine clear or pale yellow.  Eat soft, cold foods such as ice cream, frozen ice pops, or gelatin dessert.  Gargle with warm salt water (1 tsp salt per 1 qt of water).  If over age 24, throat lozenges may be used safely.  Only take over-the-counter or prescription medicines for pain, discomfort, or fever as directed by your caregiver. Do not take aspirin. To help prevent spreading viral pharyngitis to others, avoid:  Mouth-to-mouth contact with others.  Sharing utensils for eating and drinking.  Coughing around others. SEEK MEDICAL CARE IF:   You are better in a few days, then become worse.  You have a fever or pain not helped by pain medicines.  There are any other changes that concern you. Document Released: 04/15/2005 Document Revised: 09/28/2011 Document Reviewed: 09/11/2010 [facility] Patient Information 2013 [place], [place].

## 2012-08-13 LAB — CULTURE, GROUP A STREP: Organism ID, Bacteria: NORMAL

## 2012-09-03 ENCOUNTER — Other Ambulatory Visit: Payer: Self-pay

## 2012-09-21 ENCOUNTER — Ambulatory Visit (INDEPENDENT_AMBULATORY_CARE_PROVIDER_SITE_OTHER): Payer: BC Managed Care – PPO | Admitting: Cardiothoracic Surgery

## 2012-09-21 ENCOUNTER — Encounter: Payer: Self-pay | Admitting: Cardiothoracic Surgery

## 2012-09-21 VITALS — BP 114/70 | HR 92 | Resp 18 | Ht 66.5 in | Wt 122.0 lb

## 2012-09-21 DIAGNOSIS — J9383 Other pneumothorax: Secondary | ICD-10-CM

## 2012-09-21 NOTE — Progress Notes (Signed)
PCP is Provider Not In System Referring Provider is Clance, Maree Krabbe, MD  Chief Complaint  Patient presents with  . Spontaneous Pneumothorax    6 week f/u review labs    HPI: 22 year old Caucasian female nonsmoker with history of asthma who has had recent history of spontaneous right pneumothorax. Last year a moderate right pneumothorax was treated with a small bore chest catheter at a urgent care center. She was last evaluated in this office 8 months ago. She has had no recurrent symptoms of spontaneous pneumothorax. She is currently attending school at [employer] and lives in [place].  She has had problems with a persistent upper respiratory infection since late January and currently has persistent productive cough of green material. No fever. A strep throat test performed earlier the winter was apparently negative.   Past Medical History  Diagnosis Date  . Asthma   . Pneumothorax   . Allergic rhinitis     Past Surgical History  Procedure Laterality Date  . Wrist surgery      Left  . Chest tube insertion      Family History  Problem Relation Age of Onset  . Allergies Mother   . Asthma Mother   . Heart disease Mother     valve repair  . Allergies Father   . Allergies Sister   . Asthma Sister   . Breast cancer Maternal Grandmother   . Cervical cancer Maternal Grandmother     Social History History  Substance Use Topics  . Smoking status: Never Smoker   . Smokeless tobacco: Never Used  . Alcohol Use: No    Current Outpatient Prescriptions  Medication Sig Dispense Refill  . albuterol (PROVENTIL HFA;VENTOLIN HFA) 108 (90 BASE) MCG/ACT inhaler Inhale 2 puffs into the lungs every 6 (six) hours as needed. SHORTNESS OF BREATH      . aspirin-acetaminophen-caffeine (EXCEDRIN MIGRAINE) 250-250-65 MG per tablet Take 1 tablet by mouth every 6 (six) hours as needed. HEADACHE      . drospirenone-ethinyl estradiol (YASMIN,ZARAH,SYEDA) 3-0.03 MG tablet Take 1  tablet by mouth daily.  1 Package  2  . levocetirizine (XYZAL) 5 MG tablet Take 5 mg by mouth daily.        . mometasone (NASONEX) 50 MCG/ACT nasal spray Place 2 sprays into the nose daily.      . mometasone-formoterol (DULERA) 100-5 MCG/ACT AERO Inhale 2 puffs into the lungs 2 (two) times daily.      . montelukast (SINGULAIR) 10 MG tablet Take 10 mg by mouth daily.         No current facility-administered medications for this visit.    No Known Allergies  Review of Systems no fever no pleuritic chest pain or symptoms of pneumothorax  BP 114/70  Pulse 92  Resp 18  Ht 5' 6.5" (1.689 m)  Wt 122 lb (55.339 kg)  BMI 19.4 kg/m2  SpO2 98%  LMP 09/21/2012 Physical Exam Alert and comfortable Breath sounds clear and equal No crepitus in the neck Cardiac rhythm regular  Diagnostic Tests: No chest x-rays obtained today  Impression: History of pneumothorax with single episode of chest tube placement.  Persistent upper respiratory  infection Will treat with a Z-Pak. Plan: Patient will return or call if her symptoms of spontaneous pneumothorax return. Otherwise she will be followed by her primary care physician.

## 2012-10-31 ENCOUNTER — Ambulatory Visit (INDEPENDENT_AMBULATORY_CARE_PROVIDER_SITE_OTHER): Payer: BC Managed Care – PPO | Admitting: Physician Assistant

## 2012-10-31 VITALS — BP 90/62 | HR 63 | Temp 98.6°F | Resp 16 | Ht 66.58 in | Wt 124.0 lb

## 2012-10-31 DIAGNOSIS — L089 Local infection of the skin and subcutaneous tissue, unspecified: Secondary | ICD-10-CM

## 2012-10-31 MED ORDER — MUPIROCIN 2 % EX OINT: TOPICAL_OINTMENT | Freq: Two times a day (BID) | CUTANEOUS | Status: DC

## 2012-10-31 NOTE — Progress Notes (Signed)
  Subjective:    Patient ID: Alison Young, female    DOB: October 31, 1990, 21 y.o.   MRN: [id number]  HPI   Alison Young is a very pleasant 22 yr old female here with concern for a bump that she noticed today at her bikini line.  It is not painful or itchy.  She has had things like this before that have resolved spontaneously.  She does shave this area.  Denies any drainage but states it does look like it might be a pimple.  No home treatment yet.  Also began having sore throat, cough yesterday that she thinks may be attributable to allergies.  Pt has hx allergies and asthma, currently treated with Xyzal, Singulair, Nasonex, Dulera, albuterol.  Symptoms tend to be worse in spring.   Review of Systems  Constitutional: Negative for fever and chills.  HENT: Positive for sore throat.   Respiratory: Positive for cough and wheezing. Negative for shortness of breath.   Cardiovascular: Negative.   Gastrointestinal: Negative.   Musculoskeletal: Negative.   Skin:       Lesion at panty line  Neurological: Negative.        Objective:   Physical Exam  Vitals reviewed. Constitutional: She is oriented to person, place, and time. She appears well-developed and well-nourished. No distress.  HENT:  Head: Normocephalic and atraumatic.  Eyes: Conjunctivae are normal. No scleral icterus.  Cardiovascular: Normal rate, regular rhythm and normal heart sounds.  Exam reveals no gallop and no friction rub.   No murmur heard. Pulmonary/Chest: Effort normal and breath sounds normal. She has no wheezes. She has no rales.  Neurological: She is alert and oriented to person, place, and time.  Skin: Skin is warm and dry.  Small, pinpoint pustule at right groin; no TTP, or induration; no abscess  Psychiatric: She has a normal mood and affect. Her behavior is normal.     Filed Vitals:   10/31/12 1437  BP: 90/62  Pulse: 63  Temp: 98.6 F (37 C)  Resp: 16        Assessment & Plan:  Pustule - Plan:  mupirocin ointment (BACTROBAN) 2 %  Alison Young is a very pleasant 22 yr old female here with a single small pustule in the right groin.  The pustule is very superficial, there is no abscess.  Will treat with topical mupirocin.  RTC if worsening or not improving.    Suspect sore throat and cough are secondary to increased allergens during the spring.  Encouraged pt to continue allergy and asthma medications faithfully, RTC if symptoms are worsening or not improving.

## 2012-10-31 NOTE — Patient Instructions (Addendum)
Begin using the topical ointment twice daily.  I expect this area to resolve in one week or less.  If you are worsening or not improving, please let me know.

## 2013-03-24 ENCOUNTER — Ambulatory Visit: Payer: BC Managed Care – PPO | Admitting: Pulmonary Disease

## 2013-03-30 ENCOUNTER — Other Ambulatory Visit (HOSPITAL_COMMUNITY): Payer: Self-pay | Admitting: Family Medicine

## 2013-03-30 DIAGNOSIS — Z79899 Other long term (current) drug therapy: Secondary | ICD-10-CM

## 2013-03-30 DIAGNOSIS — R002 Palpitations: Secondary | ICD-10-CM

## 2013-04-07 ENCOUNTER — Ambulatory Visit (INDEPENDENT_AMBULATORY_CARE_PROVIDER_SITE_OTHER): Payer: BC Managed Care – PPO | Admitting: Cardiology

## 2013-04-07 ENCOUNTER — Encounter: Payer: Self-pay | Admitting: Cardiology

## 2013-04-07 VITALS — BP 102/64 | HR 70 | Ht 66.5 in | Wt 123.8 lb

## 2013-04-07 DIAGNOSIS — R0600 Dyspnea, unspecified: Secondary | ICD-10-CM | POA: Insufficient documentation

## 2013-04-07 DIAGNOSIS — R079 Chest pain, unspecified: Secondary | ICD-10-CM

## 2013-04-07 DIAGNOSIS — R002 Palpitations: Secondary | ICD-10-CM | POA: Insufficient documentation

## 2013-04-07 DIAGNOSIS — R0609 Other forms of dyspnea: Secondary | ICD-10-CM

## 2013-04-07 DIAGNOSIS — R42 Dizziness and giddiness: Secondary | ICD-10-CM | POA: Insufficient documentation

## 2013-04-07 LAB — CBC WITH DIFFERENTIAL/PLATELET
Basophils Absolute: 0 10*3/uL (ref 0.0–0.1)
Eosinophils Absolute: 0 10*3/uL (ref 0.0–0.7)
HCT: 38.8 % (ref 36.0–46.0)
Lymphs Abs: 2.2 10*3/uL (ref 0.7–4.0)
MCV: 88.4 fl (ref 78.0–100.0)
Monocytes Absolute: 0.4 10*3/uL (ref 0.1–1.0)
Monocytes Relative: 6.9 % (ref 3.0–12.0)
Platelets: 226 10*3/uL (ref 150.0–400.0)
RDW: 13 % (ref 11.5–14.6)

## 2013-04-07 LAB — BASIC METABOLIC PANEL
BUN: 7 mg/dL (ref 6–23)
GFR: 110.73 mL/min (ref >60.00)
Glucose, Bld: 76 mg/dL (ref 70–99)
Potassium: 3.5 mEq/L (ref 3.5–5.1)

## 2013-04-07 NOTE — Progress Notes (Signed)
Quick Note:  Please report to patient. The recent labs are stable. Continue same medication and careful diet. ______ 

## 2013-04-07 NOTE — Patient Instructions (Addendum)
Will obtain labs today and call you with the results (cbc/bmet/tsh)  Keep your appointment for echo  Your physician has recommended that you wear an event monitor. Event monitors are medical devices that record the heart's electrical activity. Doctors most often [place] these monitors to diagnose arrhythmias. Arrhythmias are problems with the speed or rhythm of the heartbeat. The monitor is a small, portable device. You can wear one while you do your normal daily activities. This is usually used to diagnose what is causing palpitations/syncope (passing out).   Your physician recommends that you continue on your current medications as directed. Please refer to the Current Medication list given to you today.

## 2013-04-07 NOTE — Progress Notes (Signed)
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  Her mother and her uncle are patients of ours.  This patient comes in because of a long history of intermittent rapid tachycardia palpitations.  These occur about once a week.  They often occur toward the end of the day or when she is tired.  They sometimes occur after exercise.  Each episode seems to start abruptly and ends gradually.  The duration is anywhere from 10 minutes up to 2 hours.  The episodes are associated with some mild chest discomfort and mild dyspnea.  The patient does not have any history of heart murmur although there is a strong family history of mitral valve prolapse in her mother and her uncle.  The patient does have a history of having had recurring spontaneous pneumothoraxes.  She thinks that she has had 4 different episodes.  Only one required a chest tube.  She has a history of asthma and is followed by Dr. Shelle Iron.  She has a past history of migraine headaches.  She does not give any history of known anemia or thyroid problems.  She is thin but her weight has been stable.  The patient has a history of attention deficit disorder and is on dexmethylphenidate.  Current Outpatient Prescriptions  Medication Sig Dispense Refill  . albuterol (PROVENTIL HFA;VENTOLIN HFA) 108 (90 BASE) MCG/ACT inhaler Inhale 2 puffs into the lungs every 6 (six) hours as needed. SHORTNESS OF BREATH      . aspirin-acetaminophen-caffeine (EXCEDRIN MIGRAINE) 250-250-65 MG per tablet Take 1 tablet by mouth every 6 (six) hours as needed. HEADACHE      . dexmethylphenidate (FOCALIN) 10 MG tablet Take 10 mg by mouth 2 (two) times daily.      . drospirenone-ethinyl estradiol (YASMIN,ZARAH,SYEDA) 3-0.03 MG tablet Take 1 tablet by mouth  daily.  1 Package  2  . EPIPEN 2-PAK 0.3 MG/0.3ML SOAJ injection 0.3 mg.      . levocetirizine (XYZAL) 5 MG tablet Take 5 mg by mouth daily.        . mometasone (NASONEX) 50 MCG/ACT nasal spray Place 2 sprays into the nose daily.      . mometasone-formoterol (DULERA) 100-5 MCG/ACT AERO Inhale 2 puffs into the lungs 2 (two) times daily.      . montelukast (SINGULAIR) 10 MG tablet Take 10 mg by mouth daily.         No current facility-administered medications for this visit.    No Known Allergies  Patient Active Problem List   Diagnosis Date Noted  . Rapid palpitations 04/07/2013  . Chest pain 04/07/2013  . Dyspnea 04/07/2013  . Dizziness 04/07/2013  . 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 Never Smoker   Smokeless tobacco  . Never Used    History  Alcohol Use No    Family History  Problem Relation Age of Onset  . Allergies Mother   . Asthma Mother   . Heart disease Mother     valve repair  . Allergies Father   . Allergies Sister   . Asthma Sister   . Breast cancer Maternal Grandmother   . Cervical cancer Maternal Grandmother     Review of Systems: Constitutional: no fever  chills diaphoresis or fatigue or change in weight.  Head and neck: no hearing loss, no epistaxis, no photophobia or visual disturbance. Respiratory: Occasional asthma and wheezing Cardiovascular: Positive for rapid heart rate up to 170 per minute Gastrointestinal: No abdominal distention, no abdominal pain, no change in bowel habits hematochezia or melena. Genitourinary: No dysuria, no frequency, no urgency, no nocturia. Musculoskeletal:No arthralgias, no back pain, no gait disturbance or myalgias. Neurological: No dizziness, positive for migraine headaches, no numbness, no seizures, no syncope, no weakness, no tremors. Hematologic: No lymphadenopathy, no easy bruising. Psychiatric: No confusion, no hallucinations, no sleep disturbance.  Positive for  ADD   Physical Exam: Filed Vitals:   04/07/13 1347  BP: 102/64  Pulse: 70   the general appearance reveals a thin young woman in no distress.The head and neck exam reveals pupils equal and reactive.  Extraocular movements are full.  There is no scleral icterus.  The mouth and pharynx are normal.  The neck is supple.  The carotids reveal no bruits.  The jugular venous pressure is normal.  The  thyroid is not enlarged.  There is no lymphadenopathy.  The chest is clear to percussion and auscultation.  There are no rales or rhonchi.  Expansion of the chest is symmetrical.  The precordium is quiet.  The first heart sound is normal.  The second heart sound is physiologically split.  There is no murmur gallop rub or click.  Auscultation supine sitting and standing did not reveal any definite mitral valve click or mitral valve murmur.  Valsalva maneuver did not elicit any click or murmur.  There is no abnormal lift or heave.  The abdomen is soft and nontender.  The bowel sounds are normal.  The liver and spleen are not enlarged.  There are no abdominal masses.  There are no abdominal bruits.  Extremities reveal good pedal pulses.  There is no phlebitis or edema.  There is no cyanosis or clubbing.  Strength is normal and symmetrical in all extremities.  There is no lateralizing weakness.  There are no sensory deficits.  The skin is warm and dry.  There is no rash.  The EKG shows normal sinus rhythm and incomplete right bundle branch block and possible left atrial enlargement.  No ischemic changes   Assessment / Plan: Paroxysmal tachycardia, probably secondary to paroxysmal SVT.  We will have her return for an event monitor to try to confirm the etiology of her arrhythmia.  Because of her history of dyspnea and tachycardia we will also do a two-dimensional echocardiogram.  We are checking labs today including CBC, TSH, and basal metabolic panel. Recheck after above workup is complete.

## 2013-04-10 ENCOUNTER — Encounter: Payer: Self-pay | Admitting: *Deleted

## 2013-04-10 ENCOUNTER — Encounter (INDEPENDENT_AMBULATORY_CARE_PROVIDER_SITE_OTHER): Payer: BC Managed Care – PPO

## 2013-04-10 DIAGNOSIS — R42 Dizziness and giddiness: Secondary | ICD-10-CM

## 2013-04-10 DIAGNOSIS — R002 Palpitations: Secondary | ICD-10-CM

## 2013-04-10 NOTE — Progress Notes (Signed)
Patient ID: Alison Young, female   DOB: [birth year], 21 y.o.   MRN: [id number] [facility] 30 day cardiac event monitor applied to patient.

## 2013-04-12 ENCOUNTER — Telehealth: Payer: Self-pay | Admitting: *Deleted

## 2013-04-12 NOTE — Telephone Encounter (Signed)
Message copied by Burnell Blanks on Wed Apr 12, 2013 12:33 PM ------      Message from: Cassell Clement      Created: Fri Apr 07, 2013  8:41 PM       Please report to patient.  The recent labs are stable. Continue same medication and careful diet. ------

## 2013-04-12 NOTE — Telephone Encounter (Signed)
Advised patient of lab results  

## 2013-04-14 ENCOUNTER — Ambulatory Visit (HOSPITAL_COMMUNITY)
Admission: RE | Admit: 2013-04-14 | Discharge: 2013-04-14 | Disposition: A | Discharge status: Home / Self Care | Payer: BC Managed Care – PPO | Source: Ambulatory Visit | Attending: Family Medicine | Admitting: Family Medicine

## 2013-04-14 DIAGNOSIS — R002 Palpitations: Secondary | ICD-10-CM

## 2013-04-14 DIAGNOSIS — R0600 Dyspnea, unspecified: Secondary | ICD-10-CM

## 2013-04-14 DIAGNOSIS — R0989 Other specified symptoms and signs involving the circulatory and respiratory systems: Secondary | ICD-10-CM | POA: Insufficient documentation

## 2013-04-14 DIAGNOSIS — Z79899 Other long term (current) drug therapy: Secondary | ICD-10-CM

## 2013-04-14 DIAGNOSIS — R0609 Other forms of dyspnea: Secondary | ICD-10-CM | POA: Insufficient documentation

## 2013-04-14 DIAGNOSIS — I079 Rheumatic tricuspid valve disease, unspecified: Secondary | ICD-10-CM | POA: Insufficient documentation

## 2013-04-14 DIAGNOSIS — R079 Chest pain, unspecified: Secondary | ICD-10-CM

## 2013-04-14 NOTE — Progress Notes (Signed)
  Echocardiogram 2D Echocardiogram has been performed.  Alison Young 04/14/2013, 2:48 PM

## 2013-04-27 ENCOUNTER — Telehealth: Payer: Self-pay | Admitting: Cardiology

## 2013-04-27 NOTE — Telephone Encounter (Signed)
New Problem:  Pt states she is calling in to get her Echo results.

## 2013-04-27 NOTE — Telephone Encounter (Signed)
Left message to call back  

## 2013-04-28 NOTE — Telephone Encounter (Signed)
Reviewed echo with patient but explained  Dr. Patty Sermons had not reviewed. Will forward for review

## 2013-04-30 NOTE — Telephone Encounter (Signed)
Please report.  Echo is okay. Good LV function. Valves okay. 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  This is a normal variant and does not require any specific treatment. No intra atrial shunt seen.

## 2013-05-02 NOTE — Telephone Encounter (Signed)
Left message to call back  

## 2013-05-02 NOTE — Telephone Encounter (Signed)
New problem:  Pt states she is calling [facility] back.

## 2013-05-02 NOTE — Telephone Encounter (Signed)
Advised patient

## 2013-05-23 ENCOUNTER — Telehealth: Payer: Self-pay | Admitting: *Deleted

## 2013-05-23 NOTE — Telephone Encounter (Signed)
Reviewed by  Dr. Patty Sermons and interpretation normal event monitor, advised patient

## 2013-05-25 ENCOUNTER — Other Ambulatory Visit: Payer: Self-pay

## 2013-09-13 ENCOUNTER — Emergency Department (HOSPITAL_COMMUNITY): Payer: BC Managed Care – PPO

## 2013-09-13 ENCOUNTER — Encounter (HOSPITAL_COMMUNITY): Payer: Self-pay | Admitting: Emergency Medicine

## 2013-09-13 ENCOUNTER — Inpatient Hospital Stay (HOSPITAL_COMMUNITY)
Admission: EM | Admit: 2013-09-13 | Discharge: 2013-09-18 | DRG: 165 | Disposition: A | Discharge status: Home / Self Care | Payer: BC Managed Care – PPO | Attending: Cardiothoracic Surgery | Admitting: Cardiothoracic Surgery

## 2013-09-13 DIAGNOSIS — R110 Nausea: Secondary | ICD-10-CM | POA: Diagnosis present

## 2013-09-13 DIAGNOSIS — Z825 Family history of asthma and other chronic lower respiratory diseases: Secondary | ICD-10-CM

## 2013-09-13 DIAGNOSIS — J45909 Unspecified asthma, uncomplicated: Secondary | ICD-10-CM | POA: Diagnosis present

## 2013-09-13 DIAGNOSIS — Z803 Family history of malignant neoplasm of breast: Secondary | ICD-10-CM

## 2013-09-13 DIAGNOSIS — I454 Nonspecific intraventricular block: Secondary | ICD-10-CM | POA: Diagnosis present

## 2013-09-13 DIAGNOSIS — J939 Pneumothorax, unspecified: Secondary | ICD-10-CM

## 2013-09-13 DIAGNOSIS — Z8249 Family history of ischemic heart disease and other diseases of the circulatory system: Secondary | ICD-10-CM

## 2013-09-13 DIAGNOSIS — Z8049 Family history of malignant neoplasm of other genital organs: Secondary | ICD-10-CM

## 2013-09-13 DIAGNOSIS — T4275XA Adverse effect of unspecified antiepileptic and sedative-hypnotic drugs, initial encounter: Secondary | ICD-10-CM | POA: Diagnosis present

## 2013-09-13 DIAGNOSIS — J9383 Other pneumothorax: Secondary | ICD-10-CM

## 2013-09-13 HISTORY — DX: Unspecified right bundle-branch block: I45.10

## 2013-09-13 LAB — URINALYSIS, ROUTINE W REFLEX MICROSCOPIC
BILIRUBIN URINE: NEGATIVE
GLUCOSE, UA: NEGATIVE mg/dL
HGB URINE DIPSTICK: NEGATIVE
Ketones, ur: NEGATIVE mg/dL
Nitrite: NEGATIVE
PH: 7 (ref 5.0–8.0)
Protein, ur: NEGATIVE mg/dL
SPECIFIC GRAVITY, URINE: 1.01 (ref 1.005–1.030)
Urobilinogen, UA: 0.2 mg/dL (ref 0.0–1.0)

## 2013-09-13 LAB — URINE MICROSCOPIC-ADD ON

## 2013-09-13 LAB — PREGNANCY, URINE: PREG TEST UR: NEGATIVE

## 2013-09-13 MED ORDER — MORPHINE SULFATE 2 MG/ML IJ SOLN
2.0000 mg | INTRAMUSCULAR | Status: DC | PRN
  Administered 2013-09-13 – 2013-09-14 (×5): 2 mg via INTRAVENOUS
  Filled 2013-09-13 (×5): qty 1

## 2013-09-13 MED ORDER — TRAMADOL HCL 50 MG PO TABS
50.0000 mg | ORAL_TABLET | Freq: Four times a day (QID) | ORAL | Status: DC | PRN
  Administered 2013-09-13: 50 mg via ORAL
  Filled 2013-09-13 (×2): qty 1

## 2013-09-13 MED ORDER — MORPHINE SULFATE 4 MG/ML IJ SOLN
4.0000 mg | Freq: Once | INTRAMUSCULAR | Status: AC
  Administered 2013-09-13: 4 mg via INTRAVENOUS
  Filled 2013-09-13: qty 1

## 2013-09-13 MED ORDER — MONTELUKAST SODIUM 10 MG PO TABS
10.0000 mg | ORAL_TABLET | Freq: Every day | ORAL | Status: DC
  Administered 2013-09-15 – 2013-09-17 (×2): 10 mg via ORAL
  Filled 2013-09-13 (×8): qty 1

## 2013-09-13 MED ORDER — DEXMETHYLPHENIDATE HCL 5 MG PO TABS: 10.0000 mg | ORAL_TABLET | Freq: Two times a day (BID) | ORAL | Status: DC | PRN

## 2013-09-13 MED ORDER — ACETAMINOPHEN 325 MG PO TABS: 650.0000 mg | ORAL_TABLET | Freq: Four times a day (QID) | ORAL | Status: DC | PRN

## 2013-09-13 NOTE — ED Notes (Signed)
Pt has hx spontaneous pneumothorax ,she began to have cp after walking to class today and it feels the same and when she had the pneumothorax. Reports mild SOB also

## 2013-09-13 NOTE — ED Notes (Signed)
Attempted report x3, no answer for 2W

## 2013-09-13 NOTE — ED Notes (Signed)
Pt tried to provide urine sample, unable to void at this time

## 2013-09-13 NOTE — ED Provider Notes (Signed)
CSN: [id number]     Arrival date & time 09/13/13  1119 History   First MD Initiated Contact with Patient 09/13/13 1150     Chief Complaint  Patient presents with  . Chest Pain     (Consider location/radiation/quality/duration/timing/severity/associated sxs/prior Treatment) HPI Comments: Patient is a 23 year old female with history of spontaneous pneumothorax. She presents today with complaints of pain in the sternum and right shoulder that started suddenly while walking to class. She is concerned she has another pneumothorax. Her pain is worse with breathing but she denies significant shortness of breath. She denies fevers, chills or cough. She denies any injury or trauma. She has been followed by Dr. Donata ClayVan Trigt for this.  Patient is a 23 y.o. female presenting with chest pain. The history is provided by the patient.  Chest Pain Pain location:  R chest Pain quality: sharp   Pain radiates to:  R shoulder Pain radiates to the back: no   Pain severity:  Moderate Onset quality:  Sudden Duration:  1 hour Timing:  Constant Progression:  Unchanged Chronicity:  Recurrent Relieved by:  Nothing Worsened by:  Deep breathing Ineffective treatments:  None tried   Past Medical History  Diagnosis Date  . Asthma   . Pneumothorax   . Allergic rhinitis   . Bundle branch block, right    Past Surgical History  Procedure Laterality Date  . Wrist surgery      Left  . Chest tube insertion     Family History  Problem Relation Age of Onset  . Allergies Mother   . Asthma Mother   . Heart disease Mother     valve repair  . Allergies Father   . Allergies Sister   . Asthma Sister   . Breast cancer Maternal Grandmother   . Cervical cancer Maternal Grandmother    History  Substance Use Topics  . Smoking status: Never Smoker   . Smokeless tobacco: Never Used  . Alcohol Use: No   OB History   Grav Para Term Preterm Abortions TAB SAB Ect Mult Living                 Review of Systems   Cardiovascular: Positive for chest pain.  All other systems reviewed and are negative.      Allergies  Review of patient's allergies indicates no known allergies.  Home Medications   Current Outpatient Rx  Name  Route  Sig  Dispense  Refill  . albuterol (PROVENTIL HFA;VENTOLIN HFA) 108 (90 BASE) MCG/ACT inhaler   Inhalation   Inhale 2 puffs into the lungs every 6 (six) hours as needed. SHORTNESS OF BREATH         . aspirin-acetaminophen-caffeine (EXCEDRIN MIGRAINE) 250-250-65 MG per tablet   Oral   Take 1 tablet by mouth every 6 (six) hours as needed. HEADACHE         . dexmethylphenidate (FOCALIN) 10 MG tablet   Oral   Take 10 mg by mouth 2 (two) times daily.         . drospirenone-ethinyl estradiol (YASMIN,ZARAH,SYEDA) 3-0.03 MG tablet   Oral   Take 1 tablet by mouth daily.   1 Package   2   . EPIPEN 2-PAK 0.3 MG/0.3ML SOAJ injection      0.3 mg.         . levocetirizine (XYZAL) 5 MG tablet   Oral   Take 5 mg by mouth daily.           . mometasone (  NASONEX) 50 MCG/ACT nasal spray   Nasal   Place 2 sprays into the nose daily.         . mometasone-formoterol (DULERA) 100-5 MCG/ACT AERO   Inhalation   Inhale 2 puffs into the lungs 2 (two) times daily.         . montelukast (SINGULAIR) 10 MG tablet   Oral   Take 10 mg by mouth daily.            BP 135/95  Pulse 118  Temp(Src) 98.1 F (36.7 C) (Oral)  Resp 18  SpO2 100% Physical Exam  Nursing note and vitals reviewed. Constitutional: She is oriented to person, place, and time. She appears well-developed and well-nourished. No distress.  HENT:  Head: Normocephalic and atraumatic.  Neck: Normal range of motion. Neck supple.  Cardiovascular: Normal rate and regular rhythm.  Exam reveals no gallop and no friction rub.   No murmur heard. Pulmonary/Chest: Effort normal and breath sounds normal. No respiratory distress. She has no wheezes. She has no rales. She exhibits no tenderness.   Abdominal: Soft. Bowel sounds are normal. She exhibits no distension. There is no tenderness.  Musculoskeletal: Normal range of motion.  Neurological: She is alert and oriented to person, place, and time.  Skin: Skin is warm and dry. She is not diaphoretic.    ED Course  Procedures (including critical care time) Labs Review Labs Reviewed - No data to display Imaging Review No results found.  EKG Interpretation    Date/Time:  Wednesday September 13 2013 11:28:45 EST Ventricular Rate:  95 PR Interval:  130 QRS Duration: 76 QT Interval:  360 QTC Calculation: 452 R Axis:   85 Text Interpretation:  Normal sinus rhythm with sinus arrhythmia Biatrial enlargement Abnormal ECG Confirmed by DELOS  MD, Susa Bones ([id number]) on 09/13/2013 11:57:23 AM            MDM   Final diagnoses:  None    The patient is a 23 year old female who presents with complaints of right-sided chest discomfort that started acutely prior to arrival. Her EKG is unremarkable, however chest x-ray shows a 10-15% apical pneumothorax on the right. This is the sixth time this has happened since she was 23 years old. She has been followed by Dr. Zenaida Niece trigt from thoracic surgery. I've discussed the case with him and he plans to admit and perform a pleurodecis.  She will be evaluated by cardiac surgery.    Geoffery Lyons, MD 09/13/13 1440

## 2013-09-13 NOTE — H&P (Signed)
Subjective:   Patient is a 23 y.o. female well known to [facility].  She has a history of recurrent Spontaneous Pneumothorax.  The patient has been followed by Dr. Donata Clay with most recent visit being March of last year.  At that time the patient had no evidence of Pneumothorax, but was treated for an Upper Respiratory Infection.  The patient presents to the Emergency Department today with complaints of pain in the sternum and right shoulder.  This started suddenly while the patient was walking to class.  She denies shortness of breath, but does state her pain is worse with deep inspiration.  CXR was obtained and showed evidence of a 10- 15% pneumothorax on the right side.  Dr. Donata Clay was consulted for further care.  Currently the patient is asymptomatic.  Her pain has resolved with use of Morphine.    Patient Active Problem List   Diagnosis Date Noted  . Spontaneous pneumothorax 09/13/2013  . Rapid palpitations 04/07/2013  . Chest pain 04/07/2013  . Dyspnea 04/07/2013  . Dizziness 04/07/2013  . 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 Asthma   . Pneumothorax   . Allergic rhinitis   . Bundle branch block, right     Past Surgical History  Procedure Laterality Date  . Wrist surgery      Left  . Chest tube insertion       (Not in a hospital admission) No Known Allergies  History  Substance Use Topics  . Smoking status: Never Smoker   . Smokeless tobacco: Never Used  . Alcohol Use: No    Family History  Problem Relation Age of Onset  . Allergies Mother   . Asthma Mother   . Heart disease Mother     valve repair  . Allergies Father   . Allergies Sister   . Asthma Sister   . Breast cancer Maternal Grandmother   . Cervical cancer Maternal Grandmother     Review of Systems Pertinent items are noted in HPI.  Objective:   Patient Vitals for the past 8 hrs:  BP Temp Temp src Pulse Resp SpO2  09/13/13 1430 116/78 mmHg - - 87 20  99 %  09/13/13 1330 122/76 mmHg - - 87 18 100 %  09/13/13 1245 145/97 mmHg - - 83 21 100 %  09/13/13 1124 135/95 mmHg 98.1 F (36.7 C) Oral 118 18 100 %   BP 121/72  Pulse 79  Temp(Src) 98.1 F (36.7 C) (Oral)  Resp 17  SpO2 99%  LMP 08/23/2013 General appearance: alert, cooperative and no distress Head: Normocephalic, without obvious abnormality, atraumatic Eyes: conjunctivae/corneas clear. PERRL, EOM's intact. Fundi benign. Neck: no adenopathy, no carotid bruit, no JVD, supple, symmetrical, trachea midline and thyroid not enlarged, symmetric, no tenderness/mass/nodules Lungs: clear to auscultation bilaterally Heart: regular rate and rhythm Abdomen: soft, non-tender; bowel sounds normal; no masses,  no organomegaly Extremities: extremities normal, atraumatic, no cyanosis or edema Skin: Skin color, texture, turgor normal. No rashes or lesions Neurologic: Grossly normal  Data Review: Radiology review: CXR with Pneumothorax on right  A/P:  1. Spontaneous Pneumothorax- on right 15%, no chest tube indicated at this time 2. Will admit patient for observation overnight, will ultimately require VATS procedure.  However, patient to discuss timing with Dr. Donata Clay.  She has midterm exams next week followed by spring break  Patient examined and chest x-ray reviewed. She continues to have persistent but small recurrent right spontaneous pneumothoraces.  She will be admitted observed and chest x-ray in a.m. She'll need right VATS surgery for the recurrent pneumothorax, timing of surgery to be determined.

## 2013-09-13 NOTE — ED Notes (Signed)
Attempted report x1, RN will call back to ED for report

## 2013-09-13 NOTE — ED Notes (Signed)
Attempted report x2, no answer on 2W

## 2013-09-14 ENCOUNTER — Encounter (HOSPITAL_COMMUNITY): Payer: Self-pay | Admitting: Certified Registered Nurse Anesthetist

## 2013-09-14 ENCOUNTER — Inpatient Hospital Stay (HOSPITAL_COMMUNITY): Payer: BC Managed Care – PPO

## 2013-09-14 LAB — PROTIME-INR
INR: 1 (ref 0.00–1.49)
Prothrombin Time: 13 seconds (ref 11.6–15.2)

## 2013-09-14 LAB — BLOOD GAS, ARTERIAL
Acid-Base Excess: 0 mmol/L (ref 0.0–2.0)
Bicarbonate: 24 mEq/L (ref 20.0–24.0)
Drawn by: 24486
FIO2: 0.21 %
O2 Saturation: 92.7 %
Patient temperature: 98.6
TCO2: 25.2 mmol/L (ref 0–100)
pCO2 arterial: 38.1 mmHg (ref 35.0–45.0)
pH, Arterial: 7.416 (ref 7.350–7.450)
pO2, Arterial: 68.9 mmHg — ABNORMAL LOW (ref 80.0–100.0)

## 2013-09-14 LAB — COMPREHENSIVE METABOLIC PANEL
ALT: 9 U/L (ref 0–35)
AST: 14 U/L (ref 0–37)
Albumin: 3.7 g/dL (ref 3.5–5.2)
Alkaline Phosphatase: 30 U/L — ABNORMAL LOW (ref 39–117)
BUN: 9 mg/dL (ref 6–23)
CO2: 28 mEq/L (ref 19–32)
Calcium: 9.6 mg/dL (ref 8.4–10.5)
Chloride: 102 mEq/L (ref 96–112)
Creatinine, Ser: 0.78 mg/dL (ref 0.50–1.10)
GFR calc Af Amer: >90 mL/min (ref >90)
GFR calc non Af Amer: >90 mL/min (ref >90)
Glucose, Bld: 80 mg/dL (ref 70–99)
Potassium: 4.3 mEq/L (ref 3.7–5.3)
Sodium: 141 mEq/L (ref 137–147)
Total Bilirubin: 0.2 mg/dL — ABNORMAL LOW (ref 0.3–1.2)
Total Protein: 7.7 g/dL (ref 6.0–8.3)

## 2013-09-14 LAB — CBC
HCT: 39.2 % (ref 36.0–46.0)
Hemoglobin: 13.4 g/dL (ref 12.0–15.0)
MCH: 30.2 pg (ref 26.0–34.0)
MCHC: 34.2 g/dL (ref 30.0–36.0)
MCV: 88.5 fL (ref 78.0–100.0)
Platelets: 211 10*3/uL (ref 150–400)
RBC: 4.43 MIL/uL (ref 3.87–5.11)
RDW: 12.4 % (ref 11.5–15.5)
WBC: 4.1 10*3/uL (ref 4.0–10.5)

## 2013-09-14 LAB — PREPARE RBC (CROSSMATCH)

## 2013-09-14 LAB — URINALYSIS, ROUTINE W REFLEX MICROSCOPIC
Bilirubin Urine: NEGATIVE
Glucose, UA: NEGATIVE mg/dL
Hgb urine dipstick: NEGATIVE
Ketones, ur: NEGATIVE mg/dL
Leukocytes, UA: NEGATIVE
Nitrite: NEGATIVE
Protein, ur: NEGATIVE mg/dL
Specific Gravity, Urine: 1.02 (ref 1.005–1.030)
Urobilinogen, UA: 0.2 mg/dL (ref 0.0–1.0)
pH: 6 (ref 5.0–8.0)

## 2013-09-14 LAB — ABO/RH: ABO/RH(D): O POS

## 2013-09-14 LAB — APTT: aPTT: 30 seconds (ref 24–37)

## 2013-09-14 MED ORDER — DEXTROSE 5 % IV SOLN
1.5000 g | INTRAVENOUS | Status: AC
  Administered 2013-09-15: 1.5 g via INTRAVENOUS
  Filled 2013-09-14: qty 1.5

## 2013-09-14 NOTE — Progress Notes (Signed)
Utilization Review Completed.Alison Young T2/26/2015  

## 2013-09-14 NOTE — Progress Notes (Signed)
Pt requesting to shower today before surgery tomorrow; MD paged at this time to make aware of request; will await callback.

## 2013-09-14 NOTE — Progress Notes (Signed)
Pt up ambulating with family at this time; no distress noted; will cont. To monitor.

## 2013-09-14 NOTE — Progress Notes (Addendum)
      [address]       [name] [id number]             [phone]       Procedure(s) (LRB): VIDEO ASSISTED THORACOSCOPY (Right) PLEURADESIS (Right)  Subjective:  Ms. Francene FindersCauble has no complaints this morning.  She is resting comfortably.  She states they decided to proceed with  surgery tomorrow morning  Objective: Vital signs in last 24 hours: Temp:  [98.1 F (36.7 C)-98.4 F (36.9 C)] 98.3 F (36.8 C) (02/26 0556) Pulse Rate:  [68-118] 77 (02/26 0556) Cardiac Rhythm:  [-] Normal sinus rhythm (02/25 1950) Resp:  [17-26] 20 (02/26 0556) BP: (94-145)/(56-97) 94/56 mmHg (02/26 0556) SpO2:  [96 %-100 %] 98 % (02/26 0556) Weight:  [125 lb 14.1 oz (57.1 kg)-127 lb 1.6 oz (57.652 kg)] 125 lb 14.1 oz (57.1 kg) (02/26 0556)  Intake/Output from previous day: 02/25 0701 - 02/26 0700 In: 120 [P.O.:120] Out: 700 [Urine:700]  General appearance: alert, cooperative and no distress Heart: regular rate and rhythm Lungs: clear to auscultation bilaterally Abdomen: soft, non-tender; bowel sounds normal; no masses,  no organomegaly  Lab Results: No results found for this basename: WBC, HGB, HCT, PLT,  in the last 72 hours BMET: No results found for this basename: NA, K, CL, CO2, GLUCOSE, BUN, CREATININE, CALCIUM,  in the last 72 hours  PT/INR: No results found for this basename: LABPROT, INR,  in the last 72 hours ABG No results found for this basename: phart, pco2, po2, hco3, tco2, acidbasedef, o2sat   CBG (last 3)  No results found for this basename: GLUCAP,  in the last 72 hours  Assessment/Plan: S/P Procedure(s) (LRB): VIDEO ASSISTED THORACOSCOPY (Right) PLEURADESIS (Right)  1. Pneumothorax on right- CXR shows no enlargement, appears to be stable, maybe slightly smaller than previous film 2. Dispo- plan for VATS in AM   LOS: 1 day    BARRETT, ERIN 09/14/2013  R VATS in am, procedure discussed in full with parents and patient for recurrent R  pneumothorax

## 2013-09-15 ENCOUNTER — Encounter (HOSPITAL_COMMUNITY): Payer: Self-pay | Admitting: Certified Registered Nurse Anesthetist

## 2013-09-15 ENCOUNTER — Encounter (HOSPITAL_COMMUNITY): Payer: BC Managed Care – PPO | Admitting: Certified Registered Nurse Anesthetist

## 2013-09-15 ENCOUNTER — Encounter (HOSPITAL_COMMUNITY)
Admission: EM | Disposition: A | Discharge status: Home / Self Care | Payer: Self-pay | Source: Home / Self Care | Attending: Cardiothoracic Surgery

## 2013-09-15 ENCOUNTER — Inpatient Hospital Stay (HOSPITAL_COMMUNITY): Payer: BC Managed Care – PPO

## 2013-09-15 ENCOUNTER — Inpatient Hospital Stay (HOSPITAL_COMMUNITY): Payer: BC Managed Care – PPO | Admitting: Certified Registered Nurse Anesthetist

## 2013-09-15 DIAGNOSIS — J9383 Other pneumothorax: Secondary | ICD-10-CM

## 2013-09-15 DIAGNOSIS — J939 Pneumothorax, unspecified: Secondary | ICD-10-CM | POA: Diagnosis present

## 2013-09-15 HISTORY — PX: VIDEO ASSISTED THORACOSCOPY: SHX5073

## 2013-09-15 HISTORY — PX: PLEURADESIS: SHX6030

## 2013-09-15 LAB — GLUCOSE, CAPILLARY
Glucose-Capillary: 91 mg/dL (ref 70–99)
Glucose-Capillary: 138 mg/dL — ABNORMAL HIGH (ref 70–99)

## 2013-09-15 LAB — MRSA PCR SCREENING: MRSA by PCR: NEGATIVE

## 2013-09-15 SURGERY — VIDEO ASSISTED THORACOSCOPY
Anesthesia: General | Site: Chest | Laterality: Right

## 2013-09-15 MED ORDER — PROPOFOL 10 MG/ML IV BOLUS
INTRAVENOUS | Status: DC | PRN
  Administered 2013-09-15: 150 mg via INTRAVENOUS

## 2013-09-15 MED ORDER — DEXTROSE 5 % IV SOLN
1.5000 g | Freq: Two times a day (BID) | INTRAVENOUS | Status: AC
  Administered 2013-09-15 – 2013-09-16 (×2): 1.5 g via INTRAVENOUS
  Filled 2013-09-15 (×2): qty 1.5

## 2013-09-15 MED ORDER — PROMETHAZINE HCL 25 MG/ML IJ SOLN: 6.2500 mg | INTRAMUSCULAR | Status: DC | PRN

## 2013-09-15 MED ORDER — HYDROMORPHONE HCL PF 1 MG/ML IJ SOLN
INTRAMUSCULAR | Status: AC
  Filled 2013-09-15: qty 1

## 2013-09-15 MED ORDER — BISACODYL 5 MG PO TBEC
10.0000 mg | DELAYED_RELEASE_TABLET | Freq: Every day | ORAL | Status: DC
  Administered 2013-09-16 – 2013-09-17 (×2): 10 mg via ORAL
  Filled 2013-09-15 (×2): qty 2

## 2013-09-15 MED ORDER — FENTANYL CITRATE 0.05 MG/ML IJ SOLN
INTRAMUSCULAR | Status: DC | PRN
  Administered 2013-09-15 (×3): 50 ug via INTRAVENOUS
  Administered 2013-09-15: 100 ug via INTRAVENOUS
  Administered 2013-09-15 (×2): 50 ug via INTRAVENOUS

## 2013-09-15 MED ORDER — OXYCODONE-ACETAMINOPHEN 5-325 MG PO TABS
1.0000 | ORAL_TABLET | ORAL | Status: DC | PRN
  Administered 2013-09-16: 1 via ORAL
  Administered 2013-09-16 (×2): 2 via ORAL
  Administered 2013-09-17 (×3): 1 via ORAL
  Administered 2013-09-17: 2 via ORAL
  Administered 2013-09-18: 1 via ORAL
  Administered 2013-09-18: 2 via ORAL
  Administered 2013-09-18: 1 via ORAL
  Filled 2013-09-15 (×2): qty 1
  Filled 2013-09-15: qty 2
  Filled 2013-09-15: qty 1
  Filled 2013-09-15: qty 2
  Filled 2013-09-15: qty 1
  Filled 2013-09-15: qty 2
  Filled 2013-09-15 (×3): qty 1
  Filled 2013-09-15: qty 2
  Filled 2013-09-15: qty 1

## 2013-09-15 MED ORDER — ONDANSETRON HCL 4 MG/2ML IJ SOLN: 4.0000 mg | Freq: Four times a day (QID) | INTRAMUSCULAR | Status: DC | PRN

## 2013-09-15 MED ORDER — LACTATED RINGERS IV SOLN
INTRAVENOUS | Status: DC | PRN
  Administered 2013-09-15: 07:00:00 via INTRAVENOUS

## 2013-09-15 MED ORDER — OXYCODONE HCL 5 MG PO TABS
5.0000 mg | ORAL_TABLET | ORAL | Status: AC | PRN
  Administered 2013-09-15: 10 mg via ORAL
  Administered 2013-09-16: 5 mg via ORAL
  Administered 2013-09-16: 10 mg via ORAL
  Filled 2013-09-15 (×2): qty 2
  Filled 2013-09-15: qty 1
  Filled 2013-09-15: qty 2

## 2013-09-15 MED ORDER — NEOSTIGMINE METHYLSULFATE 1 MG/ML IJ SOLN
INTRAMUSCULAR | Status: DC | PRN
  Administered 2013-09-15: 5 mg via INTRAVENOUS

## 2013-09-15 MED ORDER — FENTANYL CITRATE 0.05 MG/ML IJ SOLN
INTRAMUSCULAR | Status: AC
  Filled 2013-09-15: qty 5

## 2013-09-15 MED ORDER — TALC 5 G PL SUSR
INTRAPLEURAL | Status: AC
  Filled 2013-09-15: qty 10

## 2013-09-15 MED ORDER — FENTANYL 10 MCG/ML IV SOLN
INTRAVENOUS | Status: DC
  Administered 2013-09-15: 19:00:00 via INTRAVENOUS
  Administered 2013-09-16: 105 ug via INTRAVENOUS
  Administered 2013-09-16: 120 ug via INTRAVENOUS
  Administered 2013-09-16: 60 ug via INTRAVENOUS
  Administered 2013-09-16: 75 ug via INTRAVENOUS
  Administered 2013-09-16: 135 ug via INTRAVENOUS
  Administered 2013-09-16: 120 ug via INTRAVENOUS
  Administered 2013-09-17: 90 ug via INTRAVENOUS
  Administered 2013-09-17 (×2): 15 ug via INTRAVENOUS
  Administered 2013-09-17: 30 ug via INTRAVENOUS
  Filled 2013-09-15 (×2): qty 50

## 2013-09-15 MED ORDER — NEOSTIGMINE METHYLSULFATE 1 MG/ML IJ SOLN
INTRAMUSCULAR | Status: AC
  Filled 2013-09-15: qty 10

## 2013-09-15 MED ORDER — PROPOFOL 10 MG/ML IV BOLUS
INTRAVENOUS | Status: AC
  Filled 2013-09-15: qty 20

## 2013-09-15 MED ORDER — DIPHENHYDRAMINE HCL 50 MG/ML IJ SOLN: 12.5000 mg | Freq: Four times a day (QID) | INTRAMUSCULAR | Status: DC | PRN

## 2013-09-15 MED ORDER — DEXAMETHASONE SODIUM PHOSPHATE 4 MG/ML IJ SOLN
INTRAMUSCULAR | Status: DC | PRN
  Administered 2013-09-15: 8 mg via INTRAVENOUS

## 2013-09-15 MED ORDER — LORATADINE 10 MG PO TABS
10.0000 mg | ORAL_TABLET | Freq: Every day | ORAL | Status: DC
  Filled 2013-09-15 (×4): qty 1

## 2013-09-15 MED ORDER — DEXAMETHASONE SODIUM PHOSPHATE 4 MG/ML IJ SOLN
INTRAMUSCULAR | Status: AC
  Filled 2013-09-15: qty 2

## 2013-09-15 MED ORDER — TALC 5 G PL SUSR
INTRAPLEURAL | Status: DC | PRN
  Administered 2013-09-15: 1 g via INTRAPLEURAL

## 2013-09-15 MED ORDER — PHENYLEPHRINE HCL 10 MG/ML IJ SOLN
INTRAMUSCULAR | Status: DC | PRN
  Administered 2013-09-15 (×4): 40 ug via INTRAVENOUS
  Administered 2013-09-15: 80 ug via INTRAVENOUS
  Administered 2013-09-15: 40 ug via INTRAVENOUS

## 2013-09-15 MED ORDER — LIDOCAINE HCL (CARDIAC) 20 MG/ML IV SOLN
INTRAVENOUS | Status: AC
  Filled 2013-09-15: qty 5

## 2013-09-15 MED ORDER — SENNOSIDES-DOCUSATE SODIUM 8.6-50 MG PO TABS
1.0000 | ORAL_TABLET | Freq: Every evening | ORAL | Status: DC | PRN
  Filled 2013-09-15 (×2): qty 1

## 2013-09-15 MED ORDER — DIPHENHYDRAMINE HCL 12.5 MG/5ML PO ELIX
12.5000 mg | ORAL_SOLUTION | Freq: Four times a day (QID) | ORAL | Status: DC | PRN
  Filled 2013-09-15: qty 5

## 2013-09-15 MED ORDER — LEVOCETIRIZINE DIHYDROCHLORIDE 5 MG PO TABS: 5.0000 mg | ORAL_TABLET | Freq: Every day | ORAL | Status: DC

## 2013-09-15 MED ORDER — DIPHENHYDRAMINE HCL 12.5 MG/5ML PO ELIX
12.5000 mg | ORAL_SOLUTION | Freq: Four times a day (QID) | ORAL | Status: DC | PRN
Start: 2013-09-15 — End: 2013-09-18
  Administered 2013-09-16: 12.5 mg via ORAL
  Filled 2013-09-15: qty 5

## 2013-09-15 MED ORDER — NALOXONE HCL 0.4 MG/ML IJ SOLN: 0.4000 mg | INTRAMUSCULAR | Status: DC | PRN

## 2013-09-15 MED ORDER — OXYCODONE HCL 5 MG/5ML PO SOLN: 5.0000 mg | Freq: Once | ORAL | Status: DC | PRN

## 2013-09-15 MED ORDER — ROCURONIUM BROMIDE 100 MG/10ML IV SOLN
INTRAVENOUS | Status: DC | PRN
  Administered 2013-09-15: 10 mg via INTRAVENOUS
  Administered 2013-09-15: 40 mg via INTRAVENOUS

## 2013-09-15 MED ORDER — DIPHENHYDRAMINE HCL 50 MG/ML IJ SOLN
INTRAMUSCULAR | Status: AC
  Administered 2013-09-15: 12.5 mg
  Filled 2013-09-15: qty 1

## 2013-09-15 MED ORDER — SODIUM CHLORIDE 0.9 % IJ SOLN: 9.0000 mL | INTRAMUSCULAR | Status: DC | PRN

## 2013-09-15 MED ORDER — ONDANSETRON HCL 4 MG/2ML IJ SOLN
INTRAMUSCULAR | Status: DC | PRN
  Administered 2013-09-15: 4 mg via INTRAVENOUS

## 2013-09-15 MED ORDER — MIDAZOLAM HCL 5 MG/5ML IJ SOLN
INTRAMUSCULAR | Status: DC | PRN
  Administered 2013-09-15 (×2): 1 mg via INTRAVENOUS

## 2013-09-15 MED ORDER — ARTIFICIAL TEARS OP OINT
TOPICAL_OINTMENT | OPHTHALMIC | Status: AC
  Filled 2013-09-15: qty 3.5

## 2013-09-15 MED ORDER — ACETAMINOPHEN 500 MG PO TABS
1000.0000 mg | ORAL_TABLET | Freq: Four times a day (QID) | ORAL | Status: AC
  Administered 2013-09-15 – 2013-09-16 (×3): 1000 mg via ORAL
  Filled 2013-09-15 (×3): qty 2

## 2013-09-15 MED ORDER — MORPHINE SULFATE (PF) 1 MG/ML IV SOLN
INTRAVENOUS | Status: AC
  Filled 2013-09-15: qty 25

## 2013-09-15 MED ORDER — ONDANSETRON HCL 4 MG/2ML IJ SOLN
4.0000 mg | Freq: Four times a day (QID) | INTRAMUSCULAR | Status: DC | PRN
Start: 2013-09-15 — End: 2013-09-18
  Administered 2013-09-16 (×2): 4 mg via INTRAVENOUS
  Filled 2013-09-15 (×2): qty 2

## 2013-09-15 MED ORDER — MORPHINE SULFATE (PF) 1 MG/ML IV SOLN
INTRAVENOUS | Status: DC
  Administered 2013-09-15 (×2): via INTRAVENOUS
  Administered 2013-09-15: 4.06 mg via INTRAVENOUS
  Filled 2013-09-15: qty 25

## 2013-09-15 MED ORDER — POTASSIUM CHLORIDE 10 MEQ/50ML IV SOLN: 10.0000 meq | Freq: Every day | INTRAVENOUS | Status: DC | PRN

## 2013-09-15 MED ORDER — BUPIVACAINE 0.5 % ON-Q PUMP SINGLE CATH 400 ML
400.0000 mL | INJECTION | Status: DC
  Administered 2013-09-15: 400 mL
  Filled 2013-09-15: qty 400

## 2013-09-15 MED ORDER — OXYCODONE HCL 5 MG PO TABS: 5.0000 mg | ORAL_TABLET | Freq: Once | ORAL | Status: DC | PRN

## 2013-09-15 MED ORDER — ROCURONIUM BROMIDE 50 MG/5ML IV SOLN
INTRAVENOUS | Status: AC
  Filled 2013-09-15: qty 1

## 2013-09-15 MED ORDER — KCL IN DEXTROSE-NACL 20-5-0.9 MEQ/L-%-% IV SOLN
INTRAVENOUS | Status: DC
  Administered 2013-09-15: 18:00:00 via INTRAVENOUS
  Administered 2013-09-15: 100 mL/h via INTRAVENOUS
  Administered 2013-09-16 (×2): via INTRAVENOUS
  Filled 2013-09-15 (×4): qty 1000

## 2013-09-15 MED ORDER — PHENYLEPHRINE 40 MCG/ML (10ML) SYRINGE FOR IV PUSH (FOR BLOOD PRESSURE SUPPORT)
PREFILLED_SYRINGE | INTRAVENOUS | Status: AC
  Filled 2013-09-15: qty 10

## 2013-09-15 MED ORDER — 0.9 % SODIUM CHLORIDE (POUR BTL) OPTIME
TOPICAL | Status: DC | PRN
Start: 2013-09-15 — End: 2013-09-15
  Administered 2013-09-15: 2000 mL

## 2013-09-15 MED ORDER — INSULIN ASPART 100 UNIT/ML ~~LOC~~ SOLN: 0.0000 [IU] | Freq: Four times a day (QID) | SUBCUTANEOUS | Status: DC

## 2013-09-15 MED ORDER — ONDANSETRON HCL 4 MG/2ML IJ SOLN
4.0000 mg | Freq: Four times a day (QID) | INTRAMUSCULAR | Status: DC | PRN
Start: 2013-09-15 — End: 2013-09-15

## 2013-09-15 MED ORDER — HYDROMORPHONE HCL PF 1 MG/ML IJ SOLN
0.2500 mg | INTRAMUSCULAR | Status: DC | PRN
  Administered 2013-09-15 (×2): 0.5 mg via INTRAVENOUS
  Administered 2013-09-15 (×2): 0.25 mg via INTRAVENOUS
  Administered 2013-09-15: 0.5 mg via INTRAVENOUS

## 2013-09-15 MED ORDER — GLYCOPYRROLATE 0.2 MG/ML IJ SOLN
INTRAMUSCULAR | Status: DC | PRN
  Administered 2013-09-15: 0.6 mg via INTRAVENOUS

## 2013-09-15 MED ORDER — GLYCOPYRROLATE 0.2 MG/ML IJ SOLN
INTRAMUSCULAR | Status: AC
  Filled 2013-09-15: qty 1

## 2013-09-15 MED ORDER — MIDAZOLAM HCL 2 MG/2ML IJ SOLN
INTRAMUSCULAR | Status: AC
  Filled 2013-09-15: qty 2

## 2013-09-15 MED ORDER — ACETAMINOPHEN 160 MG/5ML PO SOLN
1000.0000 mg | Freq: Four times a day (QID) | ORAL | Status: AC
  Administered 2013-09-15: 1000 mg via ORAL
  Filled 2013-09-15 (×4): qty 40

## 2013-09-15 MED ORDER — LIDOCAINE HCL (CARDIAC) 20 MG/ML IV SOLN
INTRAVENOUS | Status: DC | PRN
  Administered 2013-09-15: 80 mg via INTRAVENOUS

## 2013-09-15 MED ORDER — SODIUM CHLORIDE 0.9 % IV SOLN
10.0000 mg | INTRAVENOUS | Status: DC | PRN
  Administered 2013-09-15: 10 ug/min via INTRAVENOUS

## 2013-09-15 MED ORDER — DROSPIRENONE-ETHINYL ESTRADIOL 3-0.03 MG PO TABS
1.0000 | ORAL_TABLET | Freq: Every day | ORAL | Status: DC
  Administered 2013-09-16 – 2013-09-17 (×2): 1 via ORAL

## 2013-09-15 MED ORDER — SCOPOLAMINE 1 MG/3DAYS TD PT72
MEDICATED_PATCH | TRANSDERMAL | Status: AC
  Administered 2013-09-15: 1 via TRANSDERMAL
  Filled 2013-09-15: qty 1

## 2013-09-15 MED ORDER — BUPIVACAINE 0.25 % ON-Q PUMP SINGLE CATH 400 ML
400.0000 mL | INJECTION | Status: DC
  Filled 2013-09-15: qty 400

## 2013-09-15 MED ORDER — LUNG SURGERY BOOK
Freq: Once | Status: DC
  Filled 2013-09-15: qty 1

## 2013-09-15 MED ORDER — ONDANSETRON HCL 4 MG/2ML IJ SOLN
INTRAMUSCULAR | Status: AC
  Filled 2013-09-15: qty 2

## 2013-09-15 SURGICAL SUPPLY — 72 items
ADH SKN CLS APL DERMABOND .7 (GAUZE/BANDAGES/DRESSINGS)
ADH SKN CLS LQ APL DERMABOND (GAUZE/BANDAGES/DRESSINGS) ×1
BAG DECANTER FOR FLEXI CONT (MISCELLANEOUS) IMPLANT
BLADE SURG 11 STRL SS (BLADE) ×3 IMPLANT
CANISTER SUCTION 2500CC (MISCELLANEOUS) ×3 IMPLANT
CATH KIT ON Q 5IN SLV (PAIN MANAGEMENT) ×2 IMPLANT
CATH ROBINSON RED A/P 22FR (CATHETERS) ×2 IMPLANT
CATH THORACIC 28FR (CATHETERS) IMPLANT
CATH THORACIC 36FR (CATHETERS) IMPLANT
CATH THORACIC 36FR RT ANG (CATHETERS) IMPLANT
CLEANER TIP ELECTROSURG 2X2 (MISCELLANEOUS) ×2 IMPLANT
CONT SPEC 4OZ CLIKSEAL STRL BL (MISCELLANEOUS) ×6 IMPLANT
COVER SURGICAL LIGHT HANDLE (MISCELLANEOUS) ×6 IMPLANT
DERMABOND ADHESIVE PROPEN (GAUZE/BANDAGES/DRESSINGS) ×2
DERMABOND ADVANCED (GAUZE/BANDAGES/DRESSINGS)
DERMABOND ADVANCED .7 DNX12 (GAUZE/BANDAGES/DRESSINGS) IMPLANT
DERMABOND ADVANCED .7 DNX6 (GAUZE/BANDAGES/DRESSINGS) IMPLANT
DRAPE LAPAROSCOPIC ABDOMINAL (DRAPES) ×3 IMPLANT
DRAPE WARM FLUID 44X44 (DRAPE) ×3 IMPLANT
DRSG AQUACEL AG ADV 3.5X14 (GAUZE/BANDAGES/DRESSINGS) ×3 IMPLANT
ELECT REM PT RETURN 9FT ADLT (ELECTROSURGICAL) ×3
ELECTRODE REM PT RTRN 9FT ADLT (ELECTROSURGICAL) ×1 IMPLANT
GLOVE BIO SURGEON STRL SZ 6.5 (GLOVE) ×3 IMPLANT
GLOVE BIO SURGEONS STRL SZ 6.5 (GLOVE) ×3
GLOVE BIOGEL PI IND STRL 7.0 (GLOVE) IMPLANT
GLOVE BIOGEL PI INDICATOR 7.0 (GLOVE) ×2
GLOVE ORTHO TXT STRL SZ7.5 (GLOVE) ×6 IMPLANT
GOWN STRL REUS W/ TWL LRG LVL3 (GOWN DISPOSABLE) ×3 IMPLANT
GOWN STRL REUS W/TWL LRG LVL3 (GOWN DISPOSABLE) ×12
HANDLE STAPLE ENDO GIA SHORT (STAPLE) ×2
KIT BASIN OR (CUSTOM PROCEDURE TRAY) ×3 IMPLANT
KIT ROOM TURNOVER OR (KITS) ×3 IMPLANT
KIT SUCTION CATH 14FR (SUCTIONS) ×5 IMPLANT
NS IRRIG 1000ML POUR BTL (IV SOLUTION) ×6 IMPLANT
PACK CHEST (CUSTOM PROCEDURE TRAY) ×3 IMPLANT
PAD ARMBOARD 7.5X6 YLW CONV (MISCELLANEOUS) ×6 IMPLANT
RELOAD EGIA 45 MED/THCK PURPLE (STAPLE) ×4 IMPLANT
SEALANT SURG COSEAL 4ML (VASCULAR PRODUCTS) IMPLANT
SOLUTION ANTI FOG 6CC (MISCELLANEOUS) ×3 IMPLANT
SPONGE GAUZE 4X4 12PLY (GAUZE/BANDAGES/DRESSINGS) ×3 IMPLANT
SPONGE GAUZE 4X4 12PLY STER LF (GAUZE/BANDAGES/DRESSINGS) ×2 IMPLANT
SPONGE TONSIL 1.25 RF SGL STRG (GAUZE/BANDAGES/DRESSINGS) ×3 IMPLANT
STAPLER ENDO GIA 12 SHRT THIN (STAPLE) IMPLANT
STAPLER ENDO GIA 12MM SHORT (STAPLE) ×1 IMPLANT
SUT CHROMIC 3 0 SH 27 (SUTURE) IMPLANT
SUT ETHILON 3 0 PS 1 (SUTURE) IMPLANT
SUT PROLENE 3 0 SH DA (SUTURE) IMPLANT
SUT PROLENE 4 0 RB 1 (SUTURE)
SUT PROLENE 4-0 RB1 .5 CRCL 36 (SUTURE) IMPLANT
SUT SILK  1 MH (SUTURE) ×4
SUT SILK 1 MH (SUTURE) ×2 IMPLANT
SUT SILK 2 0SH CR/8 30 (SUTURE) IMPLANT
SUT SILK 3 0SH CR/8 30 (SUTURE) ×2 IMPLANT
SUT VIC AB 1 CTX 18 (SUTURE) IMPLANT
SUT VIC AB 2 TP1 27 (SUTURE) IMPLANT
SUT VIC AB 2-0 CT2 18 VCP726D (SUTURE) IMPLANT
SUT VIC AB 2-0 CTX 36 (SUTURE) IMPLANT
SUT VIC AB 3-0 SH 18 (SUTURE) ×4 IMPLANT
SUT VIC AB 3-0 X1 27 (SUTURE) ×4 IMPLANT
SUT VICRYL 0 UR6 27IN ABS (SUTURE) ×6 IMPLANT
SUT VICRYL 2 TP 1 (SUTURE) IMPLANT
SWAB COLLECTION DEVICE MRSA (MISCELLANEOUS) IMPLANT
SYSTEM SAHARA CHEST DRAIN ATS (WOUND CARE) ×3 IMPLANT
TAPE CLOTH SURG 6X10 WHT LF (GAUZE/BANDAGES/DRESSINGS) ×2 IMPLANT
TIP APPLICATOR SPRAY EXTEND 16 (VASCULAR PRODUCTS) ×2 IMPLANT
TOWEL OR 17X24 6PK STRL BLUE (TOWEL DISPOSABLE) ×3 IMPLANT
TOWEL OR 17X26 10 PK STRL BLUE (TOWEL DISPOSABLE) ×6 IMPLANT
TRAP SPECIMEN MUCOUS 40CC (MISCELLANEOUS) IMPLANT
TRAY FOLEY CATH 16FRSI W/METER (SET/KITS/TRAYS/PACK) ×3 IMPLANT
TUBE ANAEROBIC SPECIMEN COL (MISCELLANEOUS) IMPLANT
TUNNELER SHEATH ON-Q 11GX8 DSP (PAIN MANAGEMENT) ×2 IMPLANT
WATER STERILE IRR 1000ML POUR (IV SOLUTION) ×6 IMPLANT

## 2013-09-15 NOTE — Transfer of Care (Signed)
Immediate Anesthesia Transfer of Care Note  Patient: Alison Young  Procedure(s) Performed: Procedure(s) with comments: VIDEO ASSISTED THORACOSCOPY (Right) - with stapling of blebs PLEURADESIS with TALC APPLICATION (Right)  Patient Location: PACU  Anesthesia Type:General  Level of Consciousness: awake, alert , oriented and patient cooperative  Airway & Oxygen Therapy: Patient Spontanous Breathing and Patient connected to nasal cannula oxygen  Post-op Assessment: Report given to PACU RN, Post -op Vital signs reviewed and stable and Patient moving all extremities X 4  Post vital signs: Reviewed and stable  Complications: No apparent anesthesia complications

## 2013-09-15 NOTE — Discharge Summary (Signed)
Physician Discharge Summary       [address]       [name] [id number]             [phone]    Patient ID: Alison Young MRN: [id number] DOB/AGE: 1991/02/15 23 y.o.  Admit date: 09/13/2013 Discharge date: 09/18/2013  Admission Diagnoses: Recurrent, right pneumothorax  Discharge Diagnoses:  Recurrent, right pneumothorax  Procedure (s):  RIGHT VIDEO ASSISTED THORACOSCOPY , WEDGE RESECTION of THE RUL AND SUPERIOR SEGMENT OF THE RLL, MECHANICAL PLEURODESIS, TALC PLEURODESIS and ON Q PLACEMENT by Dr. Donata ClayVan Trigt on 09/15/2013.  Pathology:   History of Presenting Illness: This is a 23 year old female with history of spontaneous pneumothorax. She presents today with complaints of pain in the sternum and right shoulder that started suddenly while walking to class. She is concerned she has another pneumothorax. Her pain is worse with breathing but she denies significant shortness of breath. She denies fevers, chills or cough. She denies any injury or trauma. She has been followed by Dr. Donata ClayVan Trigt for this.    Brief Hospital Course:  A line and foley were removed early in her post operative course. Chest tube was placed to water seal on 2/28. Daily chest x rays were taken and remained stable. Chest tube was removed on 09/17/2013. She has been tolerating a diet. She is ambulating on room air. Incisions are healing well.  She has been evaluated on today's date and is medically stable for discharge home.    Latest Vital Signs: Blood pressure 104/65, pulse 83, temperature 98 F (36.7 C), temperature source Oral, resp. rate 17, height 5\' 7"  (1.702 m), weight 58.9 kg (129 lb 13.6 oz), last menstrual period 08/23/2013, SpO2 100.00%.  Physical Exam: Cardiovascular: RRR, no murmurs, gallops, or rubs.  Pulmonary: Clear to auscultation bilaterally; no rales, wheezes, or rhonchi.  Abdomen: Soft, non tender, bowel sounds present.  Extremities: Mild bilateral lower extremity  edema.  Wounds: Clean and dry.     Discharge Condition:Stable  Recent laboratory studies:  Lab Results  Component Value Date   WBC 5.8 09/17/2013   HGB 11.3* 09/17/2013   HCT 34.4* 09/17/2013   MCV 90.3 09/17/2013   PLT 161 09/17/2013   Lab Results  Component Value Date   NA 143 09/17/2013   K 4.2 09/17/2013   CL 106 09/17/2013   CO2 25 09/17/2013   CREATININE 0.67 09/17/2013   GLUCOSE 91 09/17/2013      Diagnostic Studies: Dg Chest Portable 1 View  09/15/2013   ADDENDUM REPORT: 09/15/2013 11:11  ADDENDUM: The endotracheal described on the initial report probably represents a dual lumen endotracheal tube. One tip lies at least 2.1 cm over above the carina with a second tip within the left mainstem bronchus. Opacity at the left lung base may represent atelectasis, pneumonia, or aspiration. Followup is recommended.   Electronically Signed   By: Dwyane DeePaul  Barry M.D.   On: 09/15/2013 11:11   09/15/2013   CLINICAL DATA:  Chest tube placement, right VATS  EXAM: PORTABLE CHEST - 1 VIEW  COMPARISON:  Chest x-ray of 09/14/2013  FINDINGS: The right apical pneumothorax has resolved after insertion of right chest tube. Surgical sutures are noted in the right upper lung field. There is more opacity at the left lung base consistent with atelectasis. The endotracheal tube is in uncertain position. One portion of what appears to be the tube overlies what would be good position approximately 2.1 cm above the carinal. However a second  portion of a possible endotracheal tube overlies the course of the left mainstem bronchus and intubation of the left mainstem bronchus cannot be excluded with this film. Clinical correlation is recommended.  1. Resolution of right apical pneumothorax after insertion of right chest tube. 2. Cannot exclude intubation of the left mainstem bronchus as noted above. Correlate clinically. There is slight volume loss at the left lung base now present.  Electronically Signed: By: Dwyane Dee M.D. On:  09/15/2013 09:45        Future Appointments Provider Department Dept Phone   10/04/2013 12:15 PM [name], MD [facility] [phone]      Discharge Medications:   Medication List         albuterol 108 (90 BASE) MCG/ACT inhaler  Commonly known as:  PROVENTIL HFA;VENTOLIN HFA  Inhale 2 puffs into the lungs every 6 (six) hours as needed. SHORTNESS OF BREATH     aspirin-acetaminophen-caffeine 250-250-65 MG per tablet  Commonly known as:  EXCEDRIN MIGRAINE  Take 1 tablet by mouth every 6 (six) hours as needed. HEADACHE     dexmethylphenidate 10 MG tablet  Commonly known as:  FOCALIN  Take 10 mg by mouth 2 (two) times daily as needed (ADD).     drospirenone-ethinyl estradiol 3-0.03 MG tablet  Commonly known as:  YASMIN,ZARAH,SYEDA  Take 1 tablet by mouth daily.     EPIPEN 2-PAK 0.3 mg/0.3 mL Soaj injection  Generic drug:  EPINEPHrine  0.3 mg.     levocetirizine 5 MG tablet  Commonly known as:  XYZAL  Take 5 mg by mouth daily.     montelukast 10 MG tablet  Commonly known as:  SINGULAIR  Take 10 mg by mouth daily.     oxyCODONE-acetaminophen 5-325 MG per tablet  Commonly known as:  PERCOCET/ROXICET  Take 1-2 tablets by mouth every 4 (four) hours as needed for severe pain.         Follow Up Appointments: Follow-up Information   Follow up with VAN Dinah Beers, MD On 09/20/2013. (Office will call you with an appointment)    Specialty:  Cardiothoracic Surgery   Contact information:   [address] [place] [address] [phone]       Signed: Doree Fudge MPA-C 09/17/2013, 12:36 PM

## 2013-09-15 NOTE — Progress Notes (Signed)
Patient was transported to the operating room in stable condition and on telemetry monitor. Report given off to CRNA. Thank you.

## 2013-09-15 NOTE — Preoperative (Signed)
Beta Blockers   Reason not to administer Beta Blockers:Not Applicable 

## 2013-09-15 NOTE — Op Note (Signed)
NAMTheora Gianotti:  Alison Young, Alison Young            ACCOUNT NO.:  [id number]  MEDICAL RECORD NO.:  [id number]  LOCATION:  3S10C                        FACILITY:  [facility]  PHYSICIAN:  Kerin PernaPeter Van Trigt, M.D.  DATE OF BIRTH:  [birth year]  DATE OF PROCEDURE:  09/15/2013 DATE OF DISCHARGE:                              OPERATIVE REPORT   OPERATION: 1. Right video-assisted thoracic surgery (VATS) with stapling of     blebs. 2. Mechanical and talc powder pleurodesis. 3. Placement of wound On-Q analgesia irrigation system.  SURGEON:  Kerin PernaPeter Van Trigt, M.D.  ASSISTANT:  Doree Fudgeonielle Zimmerman, PA-C  PREOPERATIVE DIAGNOSIS:  Recurrent right spontaneous pneumothoraces.  POSTOPERATIVE DIAGNOSIS:  Recurrent right spontaneous pneumothoraces.  ANESTHESIA:  General by Dr. Ernie Avenaerrill Massagee.  INDICATIONS:  The patient is a 23 year old Caucasian female, nonsmoker who has been treated previously for right spontaneous pneumothorax with a chest tube in an outside facility approximately 2 years ago.  Within the past year, she has been admitted with a small recurrent right spontaneous pneumothorax approximately 10%.  48 hours ago, she presented with yet again another 15% spontaneous right pneumothorax while she was walking [facility] [employer].  There was no preceding trauma or any abnormal activities.  The patient was admitted for observation.  Because of the persistent recurrent nature the spontaneous pneumothoraces on the right side, it was felt that surgical intervention was indicated to prevent further similar episodes.  Prior to surgery, I discussed the procedure of right VATS, stapling of blebs, and pleurodesis with the patient and family.  I discussed the details of surgery including the use of general anesthesia, location of the incisions, the use of a postoperative chest tube system, and expected postoperative recovery.  I reviewed the risks of bleeding, persistent air leak, recurrent  pneumothorax, and infection.  She understood and agreed to proceed with surgery under what I felt was an informed consent.  OPERATIVE PROCEDURE:  The patient was brought up operating room and placed supine on the table.  General anesthesia was induced.  A double- lumen endotracheal tube was positioned by the anesthesia team.  The patient was turned to expose the right side up.  A proper time-out was performed.  Small VATS portal incisions were made around the right hemothorax.  Initially the camera was placed, then a small incision placed below the tip of the scapula.  Two other small incisions were made anterior and posterior to the scapula.  The lung was carefully examined.  It was nicely deflated.  There were no adhesions.  The lung parenchyma appeared to be pink and normal.  The diaphragm was carefully examined, and there were no implants or fenestrations.  Careful examination of the lateral aspect of the right upper lobe demonstrated a pleural irregularity consistent with a ruptured bleb.  This was excised with the Endo-GIA stapling devices and submitted to pathology.  Further exam of the apex of the right lung in the upper lobe was nonrevealing.  Further careful examination of the superior segment of right lower lobe demonstrated another pleural irregularity consistent with a scarred bleb.  This was excised using the Endo-GIA stapling device.  The remainder of lung was carefully examined and no further areas of  bleb or pleural abnormalities were noted.  Mechanical pleurodesis was then performed using the cautery sponge for abrasions of the entire circumference of the right pleural space.  This was supplemented with a small dose of powdered talc applied under direct vision.  Next 28-French chest tube was positioned and directed to the apex.  It was secured to the skin.  Next, the lung was re-expanded under direct vision.  Next, 3 VATS incisions were closed with Vicryl in  layers.  Next, an On-Q catheter was positioned between the chest tube and the VATS portal incisions and secured to the skin and connected to a 0.5% Marcaine reservoir.  The patient was then turned supine and extubated.  A chest x-ray in the operating room demonstrated good expansion of the lung and good position of the chest tube.     [name], M.D.     PV/MEDQ  D:  09/15/2013  T:  09/15/2013  Job:  [id number]

## 2013-09-15 NOTE — Discharge Instructions (Signed)
Video-Assisted Thoracic Surgery °Care After °Refer to this sheet in the next few weeks. These instructions provide you with information on caring for yourself after your procedure. Your caregiver may also give you more specific instructions. Your procedure has been planned according to current medical practices, but problems sometimes occur. Call your caregiver if you have any problems or questions after your procedure. °HOME CARE INSTRUCTIONS  °· Only take over-the-counter or prescription medications as directed. °· Only take pain medications (narcotics) as directed. °· Do not drive until your caregiver approves. Driving while taking narcotics or soon after surgery can be dangerous, so discuss the specific timing with your caregiver. °· Avoid activities that use your chest muscles, such as lifting heavy objects, for at least 3 4 weeks.   °· Take deep breaths to expand the lungs and to protect against pneumonia. °· Do breathing exercises as directed by your caregiver. If you were given an incentive spirometer to help with breathing, use it as directed. °· You may resume a normal diet and activities when you feel you are able to or as directed. °· Do not take a bath until your caregiver says it is OK. Use the shower instead.   °· Keep the bandage (dressing) covering the area where the chest tube was inserted (incision site) dry for 48 hours. After 48 hours, remove the dressing unless there is new drainage. °· Remove dressings as directed by your caregiver. °· Change dressings if necessary or as directed. °· Keep all follow-up appointments. It is important for you to see your caregiver after surgery to discuss appropriate follow-up care and surveillance, if it is necessary. °SEEK MEDICAL CARE: °· You feel excessive or increasing pain at an incision site. °· You notice bleeding, skin irritation, drainage, swelling, or redness at an incision site. °· There is a bad smell coming from an incision or dressing. °· It feels  like your heart is fluttering or beating rapidly. °· Your pain medication does not relieve your pain. °SEEK IMMEDIATE MEDICAL CARE IF:  °· You have a fever.   °· You have chest pain.  °· You have a rash. °· You have shortness of breath. °· You have trouble breathing.   °· You feel weak, lightheaded, dizzy, or faint.   °MAKE SURE YOU:  °· Understand these instructions.   °· Will watch your condition.   °· Will get help right away if you are not doing well or get worse. °Document Released: 10/31/2012 Document Reviewed: 10/31/2012 °ExitCare® Patient Information ©2014 [employer]. ° °

## 2013-09-15 NOTE — Addendum Note (Signed)
Addendum created 09/15/13 1321 by Bishop LimboJennifer S. Macklyn Glandon, CRNA   Modules edited: Charges VN

## 2013-09-15 NOTE — Anesthesia Preprocedure Evaluation (Signed)
Anesthesia Evaluation  Patient identified by MRN, date of birth, ID band Patient awake    Reviewed: Allergy & Precautions, H&P , NPO status   Airway Mallampati: I      Dental  (+) Teeth Intact   Pulmonary shortness of breath, asthma ,  breath sounds clear to auscultation        Cardiovascular + dysrhythmias Rhythm:Regular Rate:Normal     Neuro/Psych    GI/Hepatic negative GI ROS, Neg liver ROS,   Endo/Other    Renal/GU negative Renal ROS     Musculoskeletal   Abdominal   Peds  Hematology   Anesthesia Other Findings   Reproductive/Obstetrics                           Anesthesia Physical Anesthesia Plan  ASA: I  Anesthesia Plan: General   Post-op Pain Management:    Induction: Intravenous  Airway Management Planned: Double Lumen EBT  Additional Equipment: Arterial line  Intra-op Plan:   Post-operative Plan: Extubation in OR  Informed Consent: I have reviewed the patients History and Physical, chart, labs and discussed the procedure including the risks, benefits and alternatives for the proposed anesthesia with the patient or authorized representative who has indicated his/her understanding and acceptance.   Dental advisory given  Plan Discussed with: CRNA and Surgeon  Anesthesia Plan Comments:         Anesthesia Quick Evaluation

## 2013-09-15 NOTE — Anesthesia Postprocedure Evaluation (Signed)
  Anesthesia Post-op Note  Patient: Alison HackElizabeth N Young  Procedure(s) Performed: Procedure(s) with comments: VIDEO ASSISTED THORACOSCOPY (Right) - with stapling of blebs PLEURADESIS with TALC APPLICATION (Right)  Patient Location: PACU  Anesthesia Type:General  Level of Consciousness: awake and alert   Airway and Oxygen Therapy: Patient Spontanous Breathing  Post-op Pain: moderate  Post-op Assessment: Post-op Vital signs reviewed  Post-op Vital Signs: stable  Complications: No apparent anesthesia complications

## 2013-09-15 NOTE — Progress Notes (Signed)
The patient was examined and preop studies reviewed. There has been no change from the prior exam and the patient is ready for surgery.   Plan Right VATS on E Gillispie today

## 2013-09-15 NOTE — Brief Op Note (Signed)
09/13/2013 - 09/15/2013  9:27 AM  PATIENT:  Alison Young  23 y.o. female  PRE-OPERATIVE DIAGNOSIS:  RECURRENT RIGHT PNEUMOTHORAX  POST-OPERATIVE DIAGNOSIS:  RECURRENT RIGHT PNEUMOTHORAX  PROCEDURE: RIGHT VIDEO ASSISTED THORACOSCOPY , WEDGE RESECTION of THE RUL AND SUPERIOR SEGMENT OF THE RLL, MECHANICAL PLEURODESIS, TALC  PLEURODESIS and ON Q PLACEMENT  SURGEON:  Surgeon(s) and Role:    * Kerin PernaPeter Van Trigt, MD - Primary  PHYSICIAN ASSISTANT: Doree Fudgeonielle Zimmerman PA-C   ANESTHESIA:   general  EBL:  Total I/O In: 600 [I.V.:600] Out: 150 [Urine:50; Blood:100]  BLOOD ADMINISTERED:none  DRAINS: One straight 28 French chest tube in the right pleural space   LOCAL MEDICATIONS USED:  BUPIVICAINE   SPECIMEN:  Source of Specimen:  Wedge of RUL and superior segmental RLL  DISPOSITION OF SPECIMEN:  PATHOLOGY  COUNTS CORRECT:  YES  DICTATION: .Dragon Dictation  PLAN OF CARE: Admit to inpatient   PATIENT DISPOSITION:  PACU - hemodynamically stable.   Delay start of Pharmacological VTE agent (>24hrs) due to surgical blood loss or risk of bleeding: yes

## 2013-09-16 ENCOUNTER — Inpatient Hospital Stay (HOSPITAL_COMMUNITY): Payer: BC Managed Care – PPO

## 2013-09-16 LAB — BASIC METABOLIC PANEL
BUN: 5 mg/dL — AB (ref 6–23)
CO2: 23 mEq/L (ref 19–32)
CREATININE: 0.63 mg/dL (ref 0.50–1.10)
Calcium: 8.9 mg/dL (ref 8.4–10.5)
Chloride: 103 mEq/L (ref 96–112)
GFR calc non Af Amer: >90 mL/min (ref >90)
Glucose, Bld: 124 mg/dL — ABNORMAL HIGH (ref 70–99)
Potassium: 3.8 mEq/L (ref 3.7–5.3)
Sodium: 138 mEq/L (ref 137–147)

## 2013-09-16 LAB — CBC
HEMATOCRIT: 34.8 % — AB (ref 36.0–46.0)
Hemoglobin: 12.1 g/dL (ref 12.0–15.0)
MCH: 30.4 pg (ref 26.0–34.0)
MCHC: 34.8 g/dL (ref 30.0–36.0)
MCV: 87.4 fL (ref 78.0–100.0)
PLATELETS: 157 10*3/uL (ref 150–400)
RBC: 3.98 MIL/uL (ref 3.87–5.11)
RDW: 12.2 % (ref 11.5–15.5)
WBC: 9.2 10*3/uL (ref 4.0–10.5)

## 2013-09-16 MED ORDER — PROMETHAZINE HCL 25 MG/ML IJ SOLN
12.5000 mg | Freq: Once | INTRAMUSCULAR | Status: AC
  Administered 2013-09-16: 12.5 mg via INTRAVENOUS
  Filled 2013-09-16: qty 1

## 2013-09-16 NOTE — Progress Notes (Addendum)
      [address]       [name]             [phone]       1 Day Post-Op Procedure(s) (LRB): VIDEO ASSISTED THORACOSCOPY (Right) PLEURADESIS with TALC APPLICATION (Right)  Subjective: Patient has itching at a line site.  Objective: Vital signs in last 24 hours: Temp:  [97.5 F (36.4 C)-98.6 F (37 C)] 98 F (36.7 C) (02/28 0810) Pulse Rate:  [65-92] 74 (02/28 0810) Cardiac Rhythm:  [-] Normal sinus rhythm (02/28 0810) Resp:  [11-24] 20 (02/28 0815) BP: (93-125)/(48-73) 103/62 mmHg (02/28 0810) SpO2:  [97 %-100 %] 100 % (02/28 0815) Arterial Line BP: (116-156)/(54-64) 134/61 mmHg (02/28 0810) Weight:  [58.9 kg (129 lb 13.6 oz)] 58.9 kg (129 lb 13.6 oz) (02/27 1200)      Intake/Output from previous day: 02/27 0701 - 02/28 0700 In: 2610 [P.O.:660; I.V.:1850; IV Piggyback:100] Out: 2355 [Urine:1925; Blood:100; Chest Tube:330]   Physical Exam:  Cardiovascular: RRR, no murmurs, gallops, or rubs. Pulmonary: Clear to auscultation bilaterally; no rales, wheezes, or rhonchi. Abdomen: Soft, non tender, bowel sounds present. Extremities: Mild bilateral lower extremity edema. Wounds: Clean and dry.   Chest Tube: to suction, no air leak  Lab Results: CBC: Recent Labs  09/14/13 1027 09/16/13 0400  WBC 4.1 9.2  HGB 13.4 12.1  HCT 39.2 34.8*  PLT 211 157   BMET:  Recent Labs  09/14/13 1027 09/16/13 0400  NA 141 138  K 4.3 3.8  CL 102 103  CO2 28 23  GLUCOSE 80 124*  BUN 9 5*  CREATININE 0.78 0.63  CALCIUM 9.6 8.9    PT/INR:  Recent Labs  09/14/13 1027  LABPROT 13.0  INR 1.00   ABG:  INR: Will add last result for INR, ABG once components are confirmed Will add last 4 CBG results once components are confirmed  Assessment/Plan:  1. CV - SR. 2.  Pulmonary - Chest tube with 350 cc since surgery. Chest tube is to suction. There is no air leak. Likely place to water seal. CXR appears to show no pneumothorax and left base  atelectasis. 3. Remove a line 4.Decrease fluids and heplock after lunch  ZIMMERMAN,DONIELLE MPA-C 09/16/2013,9:35 AM  Agree with above. CXR looks good. CT to water seal and can remove in am is CXR unchanged.

## 2013-09-17 ENCOUNTER — Inpatient Hospital Stay (HOSPITAL_COMMUNITY): Payer: BC Managed Care – PPO

## 2013-09-17 LAB — COMPREHENSIVE METABOLIC PANEL
ALT: 11 U/L (ref 0–35)
AST: 26 U/L (ref 0–37)
Albumin: 3.1 g/dL — ABNORMAL LOW (ref 3.5–5.2)
Alkaline Phosphatase: 25 U/L — ABNORMAL LOW (ref 39–117)
BUN: 4 mg/dL — ABNORMAL LOW (ref 6–23)
CALCIUM: 9.4 mg/dL (ref 8.4–10.5)
CO2: 25 mEq/L (ref 19–32)
Chloride: 106 mEq/L (ref 96–112)
Creatinine, Ser: 0.67 mg/dL (ref 0.50–1.10)
GFR calc Af Amer: >90 mL/min (ref >90)
GFR calc non Af Amer: >90 mL/min (ref >90)
Glucose, Bld: 91 mg/dL (ref 70–99)
Potassium: 4.2 mEq/L (ref 3.7–5.3)
SODIUM: 143 meq/L (ref 137–147)
TOTAL PROTEIN: 6.7 g/dL (ref 6.0–8.3)
Total Bilirubin: 0.2 mg/dL — ABNORMAL LOW (ref 0.3–1.2)

## 2013-09-17 LAB — CBC
HCT: 34.4 % — ABNORMAL LOW (ref 36.0–46.0)
Hemoglobin: 11.3 g/dL — ABNORMAL LOW (ref 12.0–15.0)
MCH: 29.7 pg (ref 26.0–34.0)
MCHC: 32.8 g/dL (ref 30.0–36.0)
MCV: 90.3 fL (ref 78.0–100.0)
PLATELETS: 161 10*3/uL (ref 150–400)
RBC: 3.81 MIL/uL — ABNORMAL LOW (ref 3.87–5.11)
RDW: 12.6 % (ref 11.5–15.5)
WBC: 5.8 10*3/uL (ref 4.0–10.5)

## 2013-09-17 MED ORDER — OXYCODONE-ACETAMINOPHEN 5-325 MG PO TABS: 1.0000 | ORAL_TABLET | ORAL | Status: DC | PRN

## 2013-09-17 MED ORDER — LACTULOSE 10 GM/15ML PO SOLN
30.0000 g | Freq: Once | ORAL | Status: AC
  Administered 2013-09-17: 30 g via ORAL
  Filled 2013-09-17: qty 45

## 2013-09-17 NOTE — Progress Notes (Addendum)
      [address]       [name] [id number]             [phone]       2 Days Post-Op Procedure(s) (LRB): VIDEO ASSISTED THORACOSCOPY (Right) PLEURADESIS with TALC APPLICATION (Right)  Subjective: Patient had nausea. But no emesis yesterday.   Objective: Vital signs in last 24 hours: Temp:  [98 F (36.7 C)-98.6 F (37 C)] 98.6 F (37 C) (03/01 0331) Pulse Rate:  [62-88] 75 (03/01 0331) Cardiac Rhythm:  [-] Normal sinus rhythm (03/01 0331) Resp:  [16-25] 16 (03/01 0359) BP: (94-111)/(52-66) 94/58 mmHg (03/01 0331) SpO2:  [95 %-100 %] 95 % (03/01 0359)    Intake/Output from previous day: 02/28 0701 - 03/01 0700 In: 1610 [P.O.:840; I.V.:770] Out: 120 [Chest Tube:120]   Physical Exam:  Cardiovascular: RRR, no murmurs, gallops, or rubs. Pulmonary: Clear to auscultation bilaterally; no rales, wheezes, or rhonchi. Abdomen: Soft, non tender, bowel sounds present. Extremities: Mild bilateral lower extremity edema. Wounds: Clean and dry.   Chest Tube: to water seal, no air leak  Lab Results: CBC:  Recent Labs  09/16/13 0400 09/17/13 0300  WBC 9.2 5.8  HGB 12.1 11.3*  HCT 34.8* 34.4*  PLT 157 161   BMET:   Recent Labs  09/16/13 0400 09/17/13 0300  NA 138 143  K 3.8 4.2  CL 103 106  CO2 23 25  GLUCOSE 124* 91  BUN 5* 4*  CREATININE 0.63 0.67  CALCIUM 8.9 9.4    PT/INR:   Recent Labs  09/14/13 1027  LABPROT 13.0  INR 1.00   ABG:  INR: Will add last result for INR, ABG once components are confirmed Will add last 4 CBG results once components are confirmed  Assessment/Plan:  1. CV - SR. 2.  Pulmonary - Chest tube with 120 cc since surgery. Chest tube is to water seal. There is no air leak. Likely place remove chest tube.CXR appears to show patient is rotated to the left, no pneumothorax and left base atelectasis. Encourage incentive spirometer 3. Remove PCA after chest tube removed 4.GI-nausea likely secondary to narcotics  on empty stomach. Encourage po. Denies abdominal pain. 5. Possible discharge in am, if CXR stable  ZIMMERMAN,DONIELLE MPA-C 09/17/2013,9:15 AM   Chart reviewed, patient examined, agree with above. Chest tube is out. Breath sounds are equal. If CXR ok in am she can go home. She will need to return to the office in a week for chest tube suture removal.

## 2013-09-17 NOTE — Progress Notes (Signed)
Fentanyl PCA, 20 ml, wasted in sink. Witnessed by Raymon MuttonWhitney Davis, RN. Renette ButtersGolden, Viona Gilmorehristy Lee

## 2013-09-18 ENCOUNTER — Encounter (HOSPITAL_COMMUNITY): Payer: Self-pay | Admitting: Cardiothoracic Surgery

## 2013-09-18 ENCOUNTER — Inpatient Hospital Stay (HOSPITAL_COMMUNITY): Payer: BC Managed Care – PPO

## 2013-09-18 LAB — TYPE AND SCREEN
ABO/RH(D): O POS
Antibody Screen: NEGATIVE
Unit division: 0
Unit division: 0

## 2013-09-18 NOTE — Progress Notes (Signed)
Alison AwkwardElizabeth N Young to be D/C'd Home per MD order.  Discussed with the patient and all questions fully answered.    Medication List         albuterol 108 (90 BASE) MCG/ACT inhaler  Commonly known as:  PROVENTIL HFA;VENTOLIN HFA  Inhale 2 puffs into the lungs every 6 (six) hours as needed. SHORTNESS OF BREATH     aspirin-acetaminophen-caffeine 250-250-65 MG per tablet  Commonly known as:  EXCEDRIN MIGRAINE  Take 1 tablet by mouth every 6 (six) hours as needed. HEADACHE     dexmethylphenidate 10 MG tablet  Commonly known as:  FOCALIN  Take 10 mg by mouth 2 (two) times daily as needed (ADD).     drospirenone-ethinyl estradiol 3-0.03 MG tablet  Commonly known as:  YASMIN,ZARAH,SYEDA  Take 1 tablet by mouth daily.     EPIPEN 2-PAK 0.3 mg/0.3 mL Soaj injection  Generic drug:  EPINEPHrine  0.3 mg.     levocetirizine 5 MG tablet  Commonly known as:  XYZAL  Take 5 mg by mouth daily.     montelukast 10 MG tablet  Commonly known as:  SINGULAIR  Take 10 mg by mouth daily.     oxyCODONE-acetaminophen 5-325 MG per tablet  Commonly known as:  PERCOCET/ROXICET  Take 1-2 tablets by mouth every 4 (four) hours as needed for severe pain.        VVS, Skin clean, dry and intact without evidence of skin break down, no evidence of skin tears noted. IV catheter discontinued intact. Site without signs and symptoms of complications. Dressing and pressure applied.  An After Visit Summary was printed and given to the patient.  D/c education completed with patient/family including follow up instructions, medication list, d/c activities limitations if indicated, with other d/c instructions as indicated by MD - patient able to verbalize understanding, all questions fully answered.   Patient instructed to return to ED, call 911, or call MD for any changes in condition.   Patient escorted via WC, and D/C home via private auto.  Kennyth ArnoldJOHNSON, Ashyra Cantin D 09/18/2013 9:11 AM

## 2013-09-18 NOTE — Progress Notes (Addendum)
       [address]       [name] [id number]             [phone]          3 Days Post-Op Procedure(s) (LRB): VIDEO ASSISTED THORACOSCOPY (Right) PLEURADESIS with TALC APPLICATION (Right)  Subjective: Sore, but otherwise stable.   Objective: Vital signs in last 24 hours: Patient Vitals for the past 24 hrs:  BP Temp Temp src Pulse Resp SpO2  09/18/13 0422 96/52 mmHg 98.1 F (36.7 C) Oral 80 22 98 %  09/18/13 0300 - - - 105 28 100 %  09/17/13 2359 98/45 mmHg 98.4 F (36.9 C) Oral 74 23 97 %  09/17/13 2245 - - - 85 20 99 %  09/17/13 2111 92/53 mmHg 97.8 F (36.6 C) Oral 64 15 98 %  09/17/13 1545 110/64 mmHg 98.3 F (36.8 C) Oral 93 16 100 %  09/17/13 1145 104/65 mmHg 98 F (36.7 C) Oral 83 17 100 %  09/17/13 0925 100/61 mmHg 98.3 F (36.8 C) Oral 85 13 99 %   Current Weight  09/15/13 129 lb 13.6 oz (58.9 kg)     Intake/Output from previous day: 03/01 0701 - 03/02 0700 In: 1220 [P.O.:1200; I.V.:20] Out: 0     PHYSICAL EXAM:  Heart: RRR Lungs: Clear Wound: Clean and dry     Lab Results: CBC: Recent Labs  09/16/13 0400 09/17/13 0300  WBC 9.2 5.8  HGB 12.1 11.3*  HCT 34.8* 34.4*  PLT 157 161   BMET:  Recent Labs  09/16/13 0400 09/17/13 0300  NA 138 143  K 3.8 4.2  CL 103 106  CO2 23 25  GLUCOSE 124* 91  BUN 5* 4*  CREATININE 0.63 0.67  CALCIUM 8.9 9.4    PT/INR: No results found for this basename: LABPROT, INR,  in the last 72 hours  CXR: stable small right apical ptx    Assessment/Plan: S/P Procedure(s) (LRB): VIDEO ASSISTED THORACOSCOPY (Right) PLEURADESIS with TALC APPLICATION (Right) CXR stable, pt doing well.   Plan discharge home today- instructions reviewed with family.   LOS: 5 days    COLLINS,GINA H 09/18/2013

## 2013-09-19 ENCOUNTER — Other Ambulatory Visit: Payer: Self-pay | Admitting: *Deleted

## 2013-09-19 DIAGNOSIS — J939 Pneumothorax, unspecified: Secondary | ICD-10-CM

## 2013-09-20 ENCOUNTER — Encounter: Payer: Self-pay | Admitting: Cardiothoracic Surgery

## 2013-09-20 ENCOUNTER — Ambulatory Visit
Admission: RE | Admit: 2013-09-20 | Discharge: 2013-09-20 | Disposition: A | Discharge status: Home / Self Care | Payer: BC Managed Care – PPO | Source: Ambulatory Visit | Attending: Cardiothoracic Surgery | Admitting: Cardiothoracic Surgery

## 2013-09-20 ENCOUNTER — Ambulatory Visit (INDEPENDENT_AMBULATORY_CARE_PROVIDER_SITE_OTHER): Payer: BC Managed Care – PPO | Admitting: Cardiothoracic Surgery

## 2013-09-20 VITALS — BP 109/75 | HR 92 | Resp 18 | Ht 67.0 in | Wt 129.0 lb

## 2013-09-20 DIAGNOSIS — J939 Pneumothorax, unspecified: Secondary | ICD-10-CM

## 2013-09-20 DIAGNOSIS — Z09 Encounter for follow-up examination after completed treatment for conditions other than malignant neoplasm: Secondary | ICD-10-CM

## 2013-09-20 DIAGNOSIS — J9383 Other pneumothorax: Secondary | ICD-10-CM

## 2013-09-20 NOTE — Progress Notes (Signed)
PCP is Carilyn GoodpastureWILLARD,JENNIFER, PA-C Referring Provider is Carilyn GoodpastureWillard, Jennifer, PA-C  Chief Complaint  Patient presents with  . Routine Post Op    3 week f/u with CXR, S/P Rt VATS wtih stapling of blebs on 09/15/13    HPI: 5 days postop right VATS resection of blebs and pleurodesis Still having incisional pain and nausea Some shortness of breath Incisions healing and suture from chest tube site removed Chest x-ray shows slight increase in apical pneumothorax 10-15%  Past Medical History  Diagnosis Date  . Asthma   . Pneumothorax   . Allergic rhinitis   . Bundle branch block, right     Past Surgical History  Procedure Laterality Date  . Wrist surgery      Left  . Chest tube insertion    . Video assisted thoracoscopy Right 09/15/2013    Procedure: VIDEO ASSISTED THORACOSCOPY;  Surgeon: Kerin PernaPeter Van Trigt, MD;  [facility];  Service: Thoracic;  Laterality: Right;  with stapling of blebs  . Pleuradesis Right 09/15/2013    Procedure: PLEURADESIS with TALC APPLICATION;  Surgeon: Kerin PernaPeter Van Trigt, MD;  [facility];  Service: Thoracic;  Laterality: Right;    Family History  Problem Relation Age of Onset  . Allergies Mother   . Asthma Mother   . Heart disease Mother     valve repair  . Allergies Father   . Allergies Sister   . Asthma Sister   . Breast cancer Maternal Grandmother   . Cervical cancer Maternal Grandmother     Social History History  Substance Use Topics  . Smoking status: Never Smoker   . Smokeless tobacco: Never Used  . Alcohol Use: Yes    Current Outpatient Prescriptions  Medication Sig Dispense Refill  . albuterol (PROVENTIL HFA;VENTOLIN HFA) 108 (90 BASE) MCG/ACT inhaler Inhale 2 puffs into the lungs every 6 (six) hours as needed. SHORTNESS OF BREATH      . aspirin-acetaminophen-caffeine (EXCEDRIN MIGRAINE) 250-250-65 MG per tablet Take 1 tablet by mouth every 6 (six) hours as needed. HEADACHE      . dexmethylphenidate (FOCALIN) 10 MG tablet Take 10 mg by  mouth 2 (two) times daily as needed (ADD).       Marland Kitchen. drospirenone-ethinyl estradiol (YASMIN,ZARAH,SYEDA) 3-0.03 MG tablet Take 1 tablet by mouth daily.  1 Package  2  . EPIPEN 2-PAK 0.3 MG/0.3ML SOAJ injection 0.3 mg.      . levocetirizine (XYZAL) 5 MG tablet Take 5 mg by mouth daily.        . montelukast (SINGULAIR) 10 MG tablet Take 10 mg by mouth daily.         No current facility-administered medications for this visit.    No Known Allergies  Review of Systems poor appetite, nausea  BP 109/75  Pulse 92  Resp 18  Ht 5\' 7"  (1.702 m)  Wt 129 lb (58.514 kg)  BMI 20.20 kg/m2  SpO2 97%  LMP 08/23/2013 Physical Exam Responsive but uncomfortable Lungs slightly diminished breath sounds on right Surgical incision is well-healed Heart rate regular Chest tube suture removed  Diagnostic Tests: Chest x-ray reviewed showing slight increase in apical right  Pathology reviewed with patient and family demonstrating ruptured blebs in 2 wedge resections Impression: Stable course needs continued rest and followup x-ray in one week  Plan: Return with x-ray in one week Prescription for tramadol provided the patient

## 2013-09-21 ENCOUNTER — Other Ambulatory Visit: Payer: Self-pay | Admitting: *Deleted

## 2013-09-21 DIAGNOSIS — J939 Pneumothorax, unspecified: Secondary | ICD-10-CM

## 2013-09-21 NOTE — Discharge Summary (Signed)
patient examined and medical record reviewed,agree with above note. VAN TRIGT III,Celester Morgan 09/21/2013   

## 2013-09-27 ENCOUNTER — Ambulatory Visit
Admission: RE | Admit: 2013-09-27 | Discharge: 2013-09-27 | Disposition: A | Discharge status: Home / Self Care | Payer: BC Managed Care – PPO | Source: Ambulatory Visit | Attending: Cardiothoracic Surgery | Admitting: Cardiothoracic Surgery

## 2013-09-27 ENCOUNTER — Ambulatory Visit (INDEPENDENT_AMBULATORY_CARE_PROVIDER_SITE_OTHER): Payer: BC Managed Care – PPO | Admitting: Cardiothoracic Surgery

## 2013-09-27 ENCOUNTER — Encounter: Payer: Self-pay | Admitting: Cardiothoracic Surgery

## 2013-09-27 VITALS — BP 97/59 | HR 107 | Resp 16 | Ht 67.0 in | Wt 129.0 lb

## 2013-09-27 DIAGNOSIS — Z09 Encounter for follow-up examination after completed treatment for conditions other than malignant neoplasm: Secondary | ICD-10-CM

## 2013-09-27 DIAGNOSIS — J9383 Other pneumothorax: Secondary | ICD-10-CM

## 2013-09-27 DIAGNOSIS — J939 Pneumothorax, unspecified: Secondary | ICD-10-CM

## 2013-09-27 NOTE — Progress Notes (Signed)
PCP is Carilyn Goodpasture, PA-C Referring Provider is Carilyn Goodpasture, PA-C  Chief Complaint  Patient presents with  . Routine Post Op    1 wk f/u with cxr    HPI: Patient returns for 2 week followup after right VATS for stapling of blebs and pleurodesed is for recurrent spontaneous pneumothorax. She's feeling much better now. She is taking Toradol as needed for pain. Incisions are healing well. She is ready to return to class at [employer]. Chest x-ray today shows a slight residual apical space with clear lung fields, no effusion  Past Medical History  Diagnosis Date  . Asthma   . Pneumothorax   . Allergic rhinitis   . Bundle branch block, right     Past Surgical History  Procedure Laterality Date  . Wrist surgery      Left  . Chest tube insertion    . Video assisted thoracoscopy Right 09/15/2013    Procedure: VIDEO ASSISTED THORACOSCOPY;  Surgeon: [name], MD;  Location: [facility] OR;  Service: Thoracic;  Laterality: Right;  with stapling of blebs  . Pleuradesis Right 09/15/2013    Procedure: PLEURADESIS with TALC APPLICATION;  Surgeon: [name], MD;  Location: [facility] OR;  Service: Thoracic;  Laterality: Right;    Family History  Problem Relation Age of Onset  . Allergies Mother   . Asthma Mother   . Heart disease Mother     valve repair  . Allergies Father   . Allergies Sister   . Asthma Sister   . Breast cancer Maternal Grandmother   . Cervical cancer Maternal Grandmother     Social History History  Substance Use Topics  . Smoking status: Never Smoker   . Smokeless tobacco: Never Used  . Alcohol Use: Yes    Current Outpatient Prescriptions  Medication Sig Dispense Refill  . albuterol (PROVENTIL HFA;VENTOLIN HFA) 108 (90 BASE) MCG/ACT inhaler Inhale 2 puffs into the lungs every 6 (six) hours as needed. SHORTNESS OF BREATH      . aspirin-acetaminophen-caffeine (EXCEDRIN MIGRAINE) 250-250-65 MG per tablet Take 1 tablet by mouth every 6 (six) hours as  needed. HEADACHE      . dexmethylphenidate (FOCALIN) 10 MG tablet Take 10 mg by mouth 2 (two) times daily as needed (ADD).       Marland Kitchen drospirenone-ethinyl estradiol (YASMIN,ZARAH,SYEDA) 3-0.03 MG tablet Take 1 tablet by mouth daily.  1 Package  2  . EPIPEN 2-PAK 0.3 MG/0.3ML SOAJ injection 0.3 mg.      . levocetirizine (XYZAL) 5 MG tablet Take 5 mg by mouth daily.        . montelukast (SINGULAIR) 10 MG tablet Take 10 mg by mouth daily.         No current facility-administered medications for this visit.    No Known Allergies  Review of Systems improved pain and energy level  BP 97/59  Pulse 107  Resp 16  Ht 5\' 7"  (1.702 m)  Wt 129 lb (58.514 kg)  BMI 20.20 kg/m2  SpO2 98%  LMP 08/23/2013 Physical Exam Alert and comfortable Lungs clear Heart rate regular slightly increased Incision is well-healed  Diagnostic Tests: Chest x-ray shows improvement with reexpansion of right lung  Impression: Patient is ready to return to daily activities normal class schedule and driving. She'll not lift more than 5-10 pounds for the next week. She'll not resume heavy exercise until after April 1. I'll see her back in month with chest x-ray. If She needs refill for Toradol she will call  our office   Plan:Return in one month with chest x-ray

## 2013-10-04 ENCOUNTER — Ambulatory Visit: Payer: BC Managed Care – PPO | Admitting: Cardiothoracic Surgery

## 2013-11-08 ENCOUNTER — Ambulatory Visit: Payer: BC Managed Care – PPO | Admitting: Cardiothoracic Surgery

## 2013-11-09 ENCOUNTER — Other Ambulatory Visit: Payer: Self-pay | Admitting: Cardiothoracic Surgery

## 2013-11-09 DIAGNOSIS — J9383 Other pneumothorax: Secondary | ICD-10-CM

## 2013-11-15 ENCOUNTER — Encounter: Payer: Self-pay | Admitting: Cardiothoracic Surgery

## 2013-11-15 ENCOUNTER — Ambulatory Visit (INDEPENDENT_AMBULATORY_CARE_PROVIDER_SITE_OTHER): Payer: Self-pay | Admitting: Cardiothoracic Surgery

## 2013-11-15 ENCOUNTER — Ambulatory Visit
Admission: RE | Admit: 2013-11-15 | Discharge: 2013-11-15 | Disposition: A | Discharge status: Home / Self Care | Payer: BC Managed Care – PPO | Source: Ambulatory Visit | Attending: Cardiothoracic Surgery | Admitting: Cardiothoracic Surgery

## 2013-11-15 VITALS — BP 96/62 | HR 80 | Resp 18 | Ht 67.0 in | Wt 129.0 lb

## 2013-11-15 DIAGNOSIS — J9383 Other pneumothorax: Secondary | ICD-10-CM

## 2013-11-15 NOTE — Progress Notes (Signed)
PCP is Alison Young,JENNIFER, PA-C Referring Provider is Alison Young, Jennifer, PA-C  Chief Complaint  Patient presents with  . Spontaneous Pneumothorax    1 month f/u with CXR     HPI: Final 2 month followup after right VATS and stapling of blebs for recurrent spontaneous pneumothorax. No postoperative pain. No shortness of breath. Patient back to regular activities. Surgical incision is healed. Chest x-ray today shows no significant pneumothorax. Lungs clear. No pleural effusion.   Past Medical History  Diagnosis Date  . Asthma   . Pneumothorax   . Allergic rhinitis   . Bundle branch block, right     Past Surgical History  Procedure Laterality Date  . Wrist surgery      Left  . Chest tube insertion    . Video assisted thoracoscopy Right 09/15/2013    Procedure: VIDEO ASSISTED THORACOSCOPY;  Surgeon: Kerin PernaPeter Van Trigt, MD;  [facility];  Service: Thoracic;  Laterality: Right;  with stapling of blebs  . Pleuradesis Right 09/15/2013    Procedure: PLEURADESIS with TALC APPLICATION;  Surgeon: Kerin PernaPeter Van Trigt, MD;  [facility];  Service: Thoracic;  Laterality: Right;    Family History  Problem Relation Age of Onset  . Allergies Mother   . Asthma Mother   . Heart disease Mother     valve repair  . Allergies Father   . Allergies Sister   . Asthma Sister   . Breast cancer Maternal Grandmother   . Cervical cancer Maternal Grandmother     Social History History  Substance Use Topics  . Smoking status: Never Smoker   . Smokeless tobacco: Never Used  . Alcohol Use: Yes    Current Outpatient Prescriptions  Medication Sig Dispense Refill  . albuterol (PROVENTIL HFA;VENTOLIN HFA) 108 (90 BASE) MCG/ACT inhaler Inhale 2 puffs into the lungs every 6 (six) hours as needed. SHORTNESS OF BREATH      . aspirin-acetaminophen-caffeine (EXCEDRIN MIGRAINE) 250-250-65 MG per tablet Take 1 tablet by mouth every 6 (six) hours as needed. HEADACHE      . dexmethylphenidate (FOCALIN) 10 MG tablet  Take 10 mg by mouth 2 (two) times daily as needed (ADD).       Marland Kitchen. drospirenone-ethinyl estradiol (YASMIN,ZARAH,SYEDA) 3-0.03 MG tablet Take 1 tablet by mouth daily.  1 Package  2  . EPIPEN 2-PAK 0.3 MG/0.3ML SOAJ injection 0.3 mg.      . levocetirizine (XYZAL) 5 MG tablet Take 5 mg by mouth daily.        . montelukast (SINGULAIR) 10 MG tablet Take 10 mg by mouth daily.         No current facility-administered medications for this visit.    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

## 2014-02-05 ENCOUNTER — Encounter: Payer: Self-pay | Admitting: *Deleted

## 2014-03-16 ENCOUNTER — Ambulatory Visit (INDEPENDENT_AMBULATORY_CARE_PROVIDER_SITE_OTHER): Payer: BC Managed Care – PPO | Admitting: Physician Assistant

## 2014-03-16 VITALS — BP 108/80 | HR 108 | Temp 98.6°F | Resp 20 | Ht 66.0 in | Wt 128.0 lb

## 2014-03-16 DIAGNOSIS — N898 Other specified noninflammatory disorders of vagina: Secondary | ICD-10-CM

## 2014-03-16 LAB — POCT WET PREP WITH KOH
KOH Prep POC: NEGATIVE
Trichomonas, UA: NEGATIVE
Yeast Wet Prep HPF POC: NEGATIVE

## 2014-03-16 NOTE — Progress Notes (Signed)
   Subjective:    Patient ID: Alison Young, female    DOB: Feb 28, 1991, 23 y.o.   MRN: [id number]  HPI Pt presents to clinic with vaginal discharge for about 5 days - started after her menses.  She is currently not sexually active for the past year.  About 1 week prior to her menses she had 1 day of green vaginal discharge that has not reoccurred.  She is not having pain or irritation but she is having a cramping like sensation in her vagina that is different that her normal menstrual cramps. She did miss several OCPs on her last pack of pills.    Review of Systems  Constitutional: Negative for fever and chills.  Genitourinary: Positive for vaginal discharge. Negative for vaginal bleeding, vaginal pain and menstrual problem.       Objective:   Physical Exam  Vitals reviewed. Constitutional: She is oriented to person, place, and time. She appears well-developed and well-nourished.  HENT:  Head: Normocephalic and atraumatic.  Right Ear: External ear normal.  Left Ear: External ear normal.  Eyes: Conjunctivae are normal.  Neck: Normal range of motion.  Cardiovascular: Normal rate, regular rhythm and normal heart sounds.   Pulmonary/Chest: Effort normal and breath sounds normal. She has no wheezes.  Abdominal: Soft. Bowel sounds are normal.  Genitourinary: Pelvic exam was performed with patient supine. There is no rash, tenderness, lesion or injury on the right labia. There is no rash, tenderness, lesion or injury on the left labia. Vaginal discharge (thick white discharge with mucus like discharge also) found.  Neurological: She is alert and oriented to person, place, and time.  Skin: Skin is warm and dry.  Psychiatric: She has a normal mood and affect. Her behavior is normal. Judgment and thought content normal.   Results for orders placed in visit on 03/16/14  POCT WET PREP WITH KOH      Result Value Ref Range   Trichomonas, UA Negative     Clue Cells Wet Prep HPF POC 0-3     Epithelial Wet Prep HPF POC 6-16     Yeast Wet Prep HPF POC neg     Bacteria Wet Prep HPF POC 4+     RBC Wet Prep HPF POC 0-1     WBC Wet Prep HPF POC 1-4     KOH Prep POC Negative         Assessment & Plan:  Vaginal discharge - Plan: POCT Wet Prep with KOH, GC/Chlamydia Probe Amp  Pt's wet prep has a lot of bacteria - we are waiting on the uriprobe but because of no sexually relationships in over a year this is not likely.  Her vaginal discharge had a similar appearance to a yeast infection so if she starts to develop vaginal itching in the next couple of days she will let me know and we will treat her for yeast infection.  She also has a lot of mucus and I wonder if it is related to her missed pills last month.  If all labs are normal we are going to wait and see through her current pack of pills.  Answered her questions and she agrees with the plan.  Benny Lennert PA-C  [facility] 03/16/2014 6:45 PM

## 2014-03-17 LAB — GC/CHLAMYDIA PROBE AMP
CT PROBE, AMP APTIMA: NEGATIVE
GC Probe RNA: NEGATIVE

## 2014-03-26 ENCOUNTER — Ambulatory Visit (INDEPENDENT_AMBULATORY_CARE_PROVIDER_SITE_OTHER): Payer: BC Managed Care – PPO | Admitting: Family Medicine

## 2014-03-26 VITALS — BP 90/58 | HR 90 | Temp 98.9°F | Resp 16 | Ht 66.5 in | Wt 130.0 lb

## 2014-03-26 DIAGNOSIS — J029 Acute pharyngitis, unspecified: Secondary | ICD-10-CM

## 2014-03-26 DIAGNOSIS — J4520 Mild intermittent asthma, uncomplicated: Secondary | ICD-10-CM

## 2014-03-26 DIAGNOSIS — F909 Attention-deficit hyperactivity disorder, unspecified type: Secondary | ICD-10-CM

## 2014-03-26 DIAGNOSIS — J45909 Unspecified asthma, uncomplicated: Secondary | ICD-10-CM

## 2014-03-26 DIAGNOSIS — J020 Streptococcal pharyngitis: Secondary | ICD-10-CM

## 2014-03-26 LAB — POCT RAPID STREP A (OFFICE): Rapid Strep A Screen: POSITIVE — AB

## 2014-03-26 MED ORDER — CEFDINIR 300 MG PO CAPS: 300.0000 mg | ORAL_CAPSULE | Freq: Two times a day (BID) | ORAL | Status: DC

## 2014-03-26 NOTE — Progress Notes (Addendum)
Subjective:  This chart was scribed for [name], MD by Carl Best, Medical Scribe. This patient was seen in Room 8 and the patient's care was started at 4:17 PM.   Patient ID: Alison Young, female    DOB: May 24, 1991, 23 y.o.   MRN: [id number]  HPI HPI Comments: Alison Young is a 23 y.o. female who presents to the [facility] complaining of constant sore throat with associated fever, myalgias, and nausea that started 2 days ago.  She states that she has a history of strep throat.    Past Medical History  Diagnosis Date  . Asthma   . Pneumothorax   . Allergic rhinitis   . Bundle branch block, right    Past Surgical History  Procedure Laterality Date  . Wrist surgery      Left  . Chest tube insertion    . Video assisted thoracoscopy Right 09/15/2013    Procedure: VIDEO ASSISTED THORACOSCOPY;  Surgeon: [name], MD;  Location: [facility] OR;  Service: Thoracic;  Laterality: Right;  with stapling of blebs  . Pleuradesis Right 09/15/2013    Procedure: PLEURADESIS with TALC APPLICATION;  Surgeon: [name], MD;  Location: [facility] OR;  Service: Thoracic;  Laterality: Right;   Family History  Problem Relation Age of Onset  . Allergies Mother   . Asthma Mother   . Heart disease Mother     valve repair  . Allergies Father   . Allergies Sister   . Asthma Sister   . Breast cancer Maternal Grandmother   . Cervical cancer Maternal Grandmother    History   Social History  . Marital Status: Single    Spouse Name: [name]    Number of Children: 0  . Years of Education: [name]   Occupational History  . student    Social History Main Topics  . Smoking status: Never Smoker   . Smokeless tobacco: Never Used  . Alcohol Use: Yes  . Drug Use: No  . Sexual Activity: Yes    Birth Control/ Protection: Pill   Other Topics Concern  . Not on file   Social History Narrative  . No narrative on file   No Known Allergies  Review of Systems    Constitutional: Positive for fever.  HENT: Positive for sore throat and trouble swallowing.      Objective:  Physical Exam  Nursing note and vitals reviewed. Constitutional: She is oriented to person, place, and time. She appears well-developed and well-nourished.  HENT:  Head: Normocephalic and atraumatic.  Right Ear: External ear normal.  Left Ear: External ear normal.  Nose: Nose normal.  Mouth/Throat: Posterior oropharyngeal erythema present.  Oropharynx is mildly erythematous with white exudate on the right tonsil.   Eyes: EOM are normal.  Neck: Normal range of motion. Neck supple. No thyromegaly present.  Mildly swollen anterior cervical nodes.    Cardiovascular: Normal rate, regular rhythm and normal heart sounds.   No murmur heard. Pulmonary/Chest: Effort normal and breath sounds normal. No respiratory distress. She has no wheezes. She has no rales.  Musculoskeletal: Normal range of motion.  Lymphadenopathy:    She has cervical adenopathy.  Neurological: She is alert and oriented to person, place, and time.  Skin: Skin is warm and dry.  Psychiatric: She has a normal mood and affect. Her behavior is normal.   Results for orders placed in visit on 03/26/14  POCT RAPID STREP A (OFFICE)      Result Value  Ref Range   Rapid Strep A Screen Positive (*) Negative     BP 90/58  Pulse 90  Temp(Src) 98.9 F (37.2 C) (Oral)  Resp 16  Ht 5' 6.5" (1.689 m)  Wt 130 lb (58.968 kg)  BMI 20.67 kg/m2  SpO2 100%  LMP 03/05/2014 Assessment & Plan:  I personally performed the services described in this documentation, which was scribed in my presence. The recorded information has been reviewed and is accurate.  Sore throat - Plan: POCT rapid strep A, cefdinir (OMNICEF) 300 MG capsule  Reactive airway disease, mild intermittent, uncomplicated  Attention deficit hyperactivity disorder (ADHD), unspecified ADHD type  Streptococcal sore throat - Plan: cefdinir (OMNICEF) 300 MG  capsule  Signed, [name], MD

## 2014-03-26 NOTE — Patient Instructions (Signed)

## 2014-06-15 IMAGING — CR DG CHEST 1V PORT
1 series · 1 of 1 positions shown · non-contrast
Comparison: DG CHEST 1V PORT dated 09/15/2013

CLINICAL DATA: Postop for VATS .  Chest tube.

EXAM:
PORTABLE CHEST - 1 VIEW

[AP]
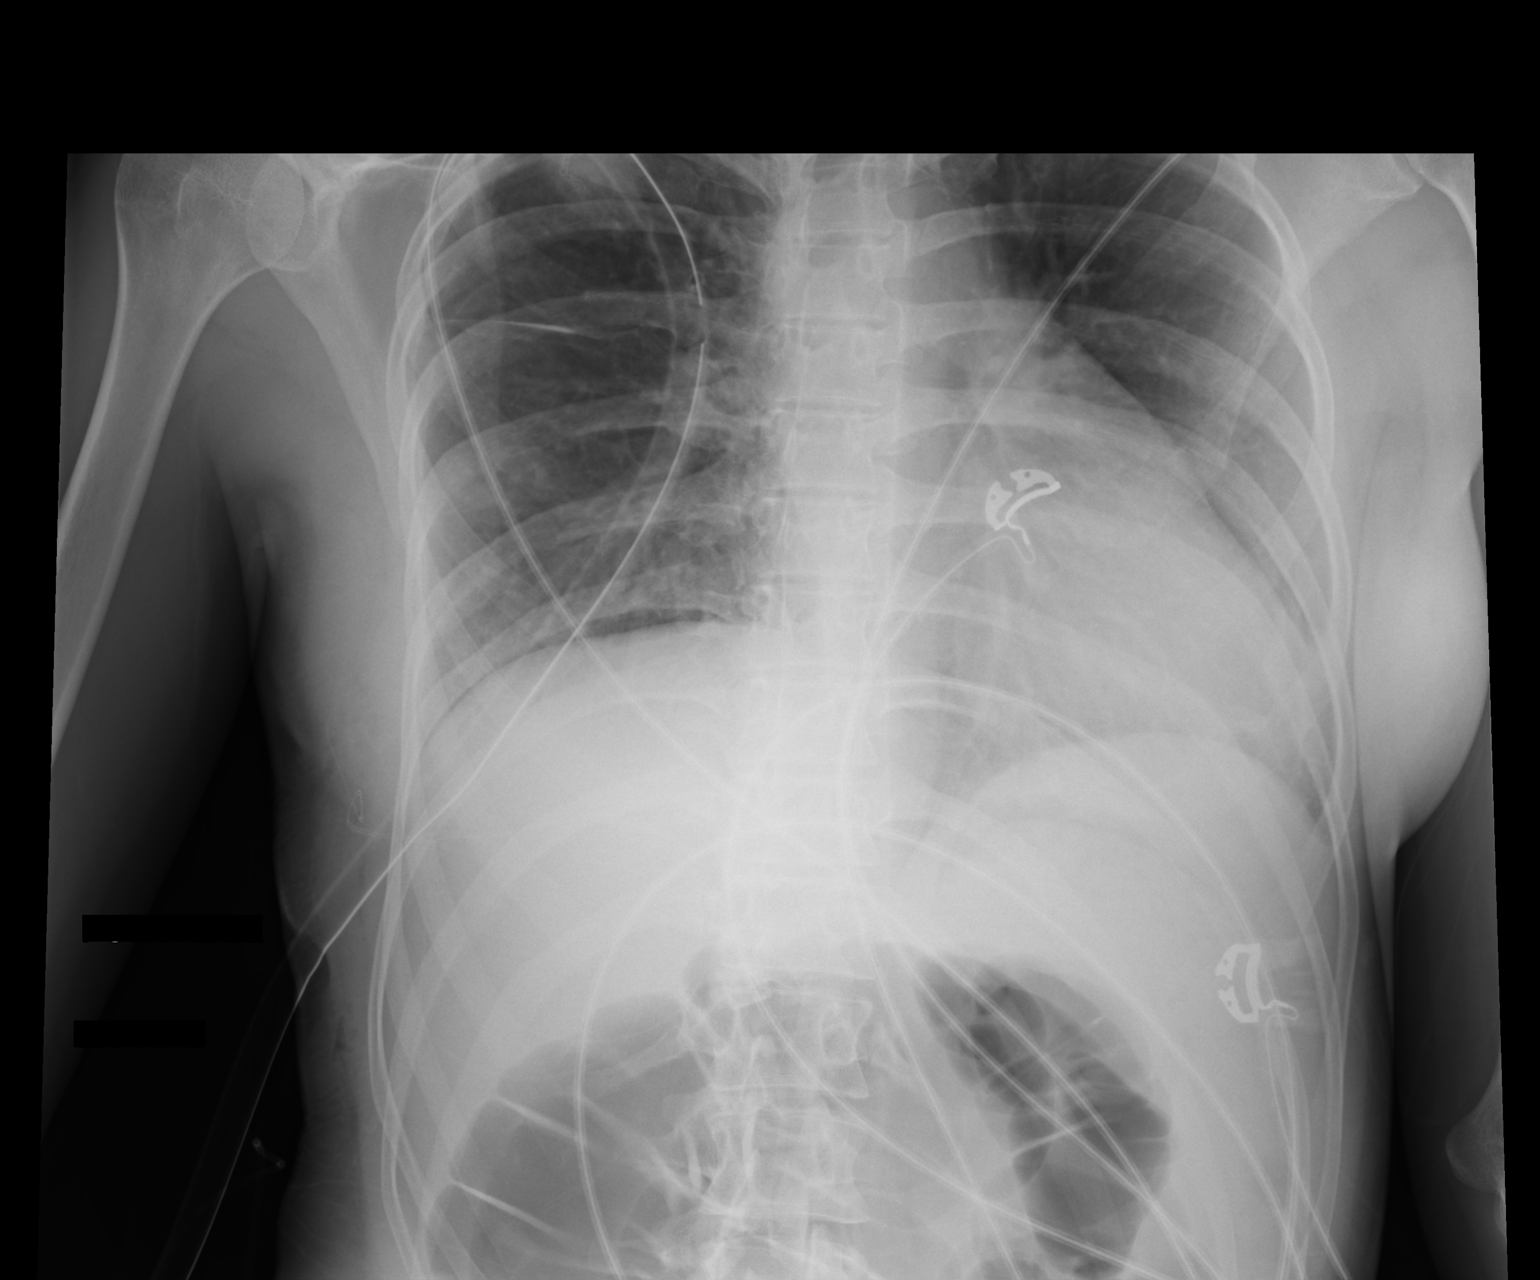

[1 of 1 positions shown; findings below may reference images not displayed]

FINDINGS: A right-sided chest tube is unchanged in position.  No pneumothorax.

interval removal of double lumen endotracheal and left mainstem
bronchus tube. normal heart size. no pleural fluid. mild right
hemidiaphragm elevation. low lung volumes with resultant pulmonary
interstitial prominence. mild left base airspace disease is
improved. mildly prominent gas-filled bowel loops in the upper
abdomen. numerous leads and wires project over the chest.
IMPRESSION: Extubation, with improvement in left base airspace disease.

Right chest tube remaining in place, without pneumothorax.

Prominent gas-filled bowel loops in the upper abdomen. Correlate
with abdominal symptoms.

## 2014-08-14 IMAGING — CR DG CHEST 2V
2 series · 2 of 2 positions shown · non-contrast
Comparison: 09/27/2013

CLINICAL DATA: Pneumothorax.

EXAM:
CHEST  2 VIEW

[w chest pa]
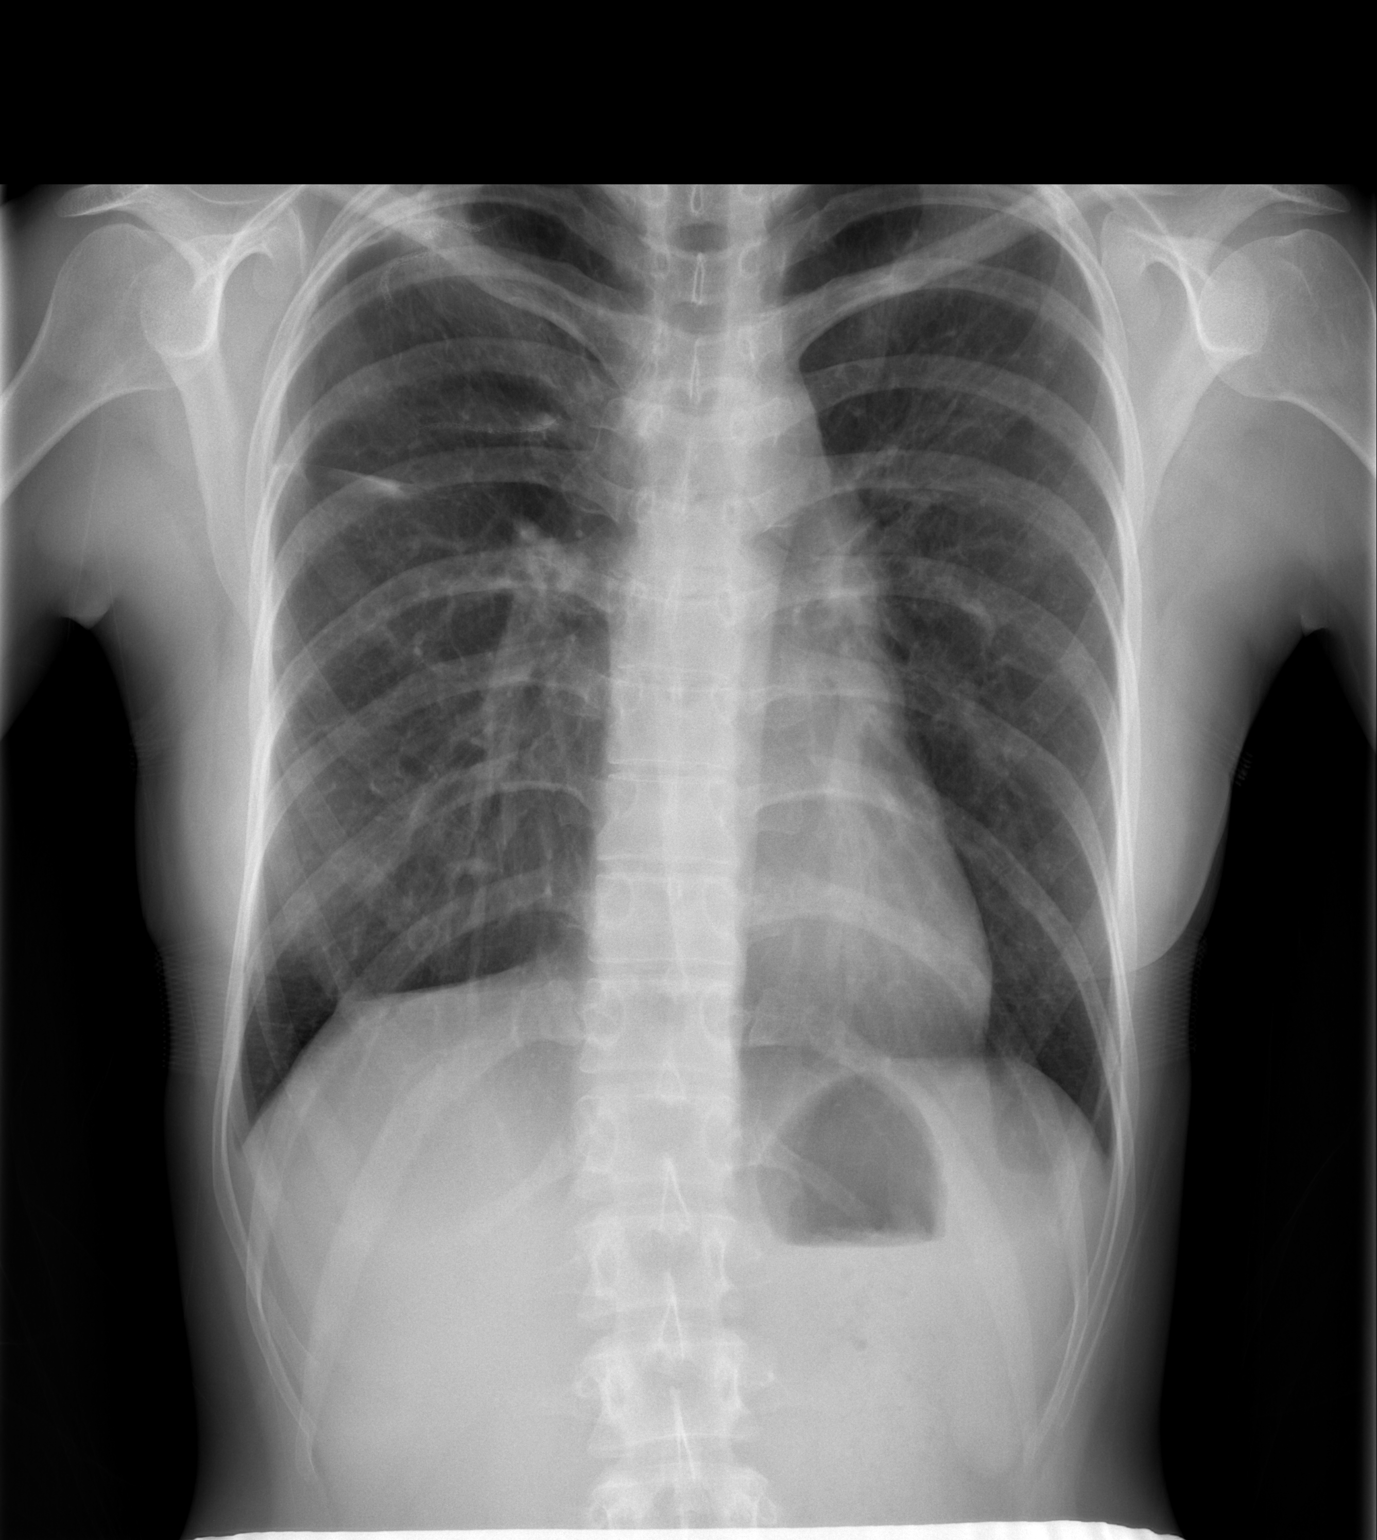

[w chest lat]
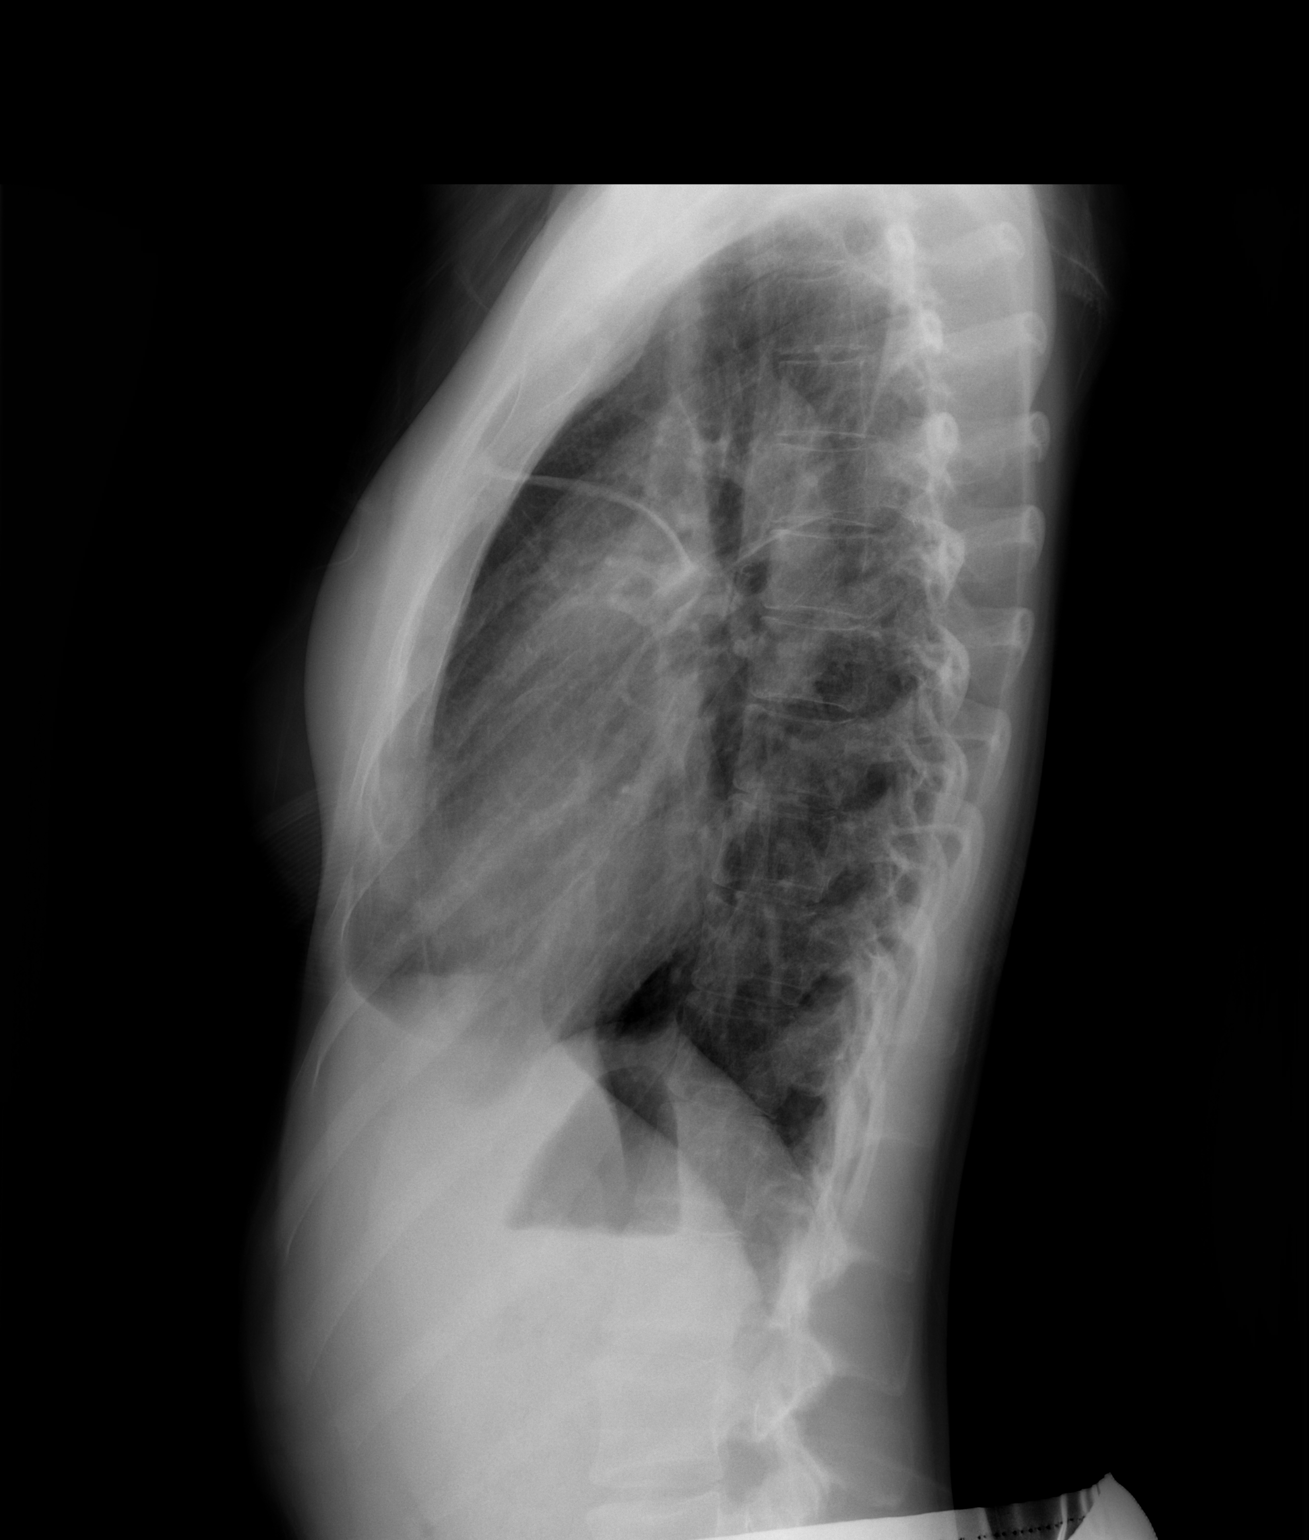

[2 of 2 positions shown; findings below may reference images not displayed]

FINDINGS: The cardiomediastinal silhouette is within normal limits. Surgical
staple line again projects over the right lung apex. Small right
apical pneumothorax has decreased slightly from the prior study.
Left lung remains clear. No pleural effusion is identified. No acute
osseous abnormality is seen.
IMPRESSION: Small right apical pneumothorax, slightly decreased from prior.

## 2016-06-09 ENCOUNTER — Ambulatory Visit (INDEPENDENT_AMBULATORY_CARE_PROVIDER_SITE_OTHER): Payer: BLUE CROSS/BLUE SHIELD | Admitting: Family Medicine

## 2016-06-09 VITALS — BP 112/76 | HR 95 | Temp 98.1°F | Resp 16 | Ht 67.0 in | Wt 130.0 lb

## 2016-06-09 DIAGNOSIS — J069 Acute upper respiratory infection, unspecified: Secondary | ICD-10-CM | POA: Diagnosis not present

## 2016-06-09 DIAGNOSIS — Z8709 Personal history of other diseases of the respiratory system: Secondary | ICD-10-CM

## 2016-06-09 DIAGNOSIS — J3489 Other specified disorders of nose and nasal sinuses: Secondary | ICD-10-CM

## 2016-06-09 MED ORDER — AZITHROMYCIN 250 MG PO TABS: ORAL_TABLET | ORAL | 0 refills | Status: DC

## 2016-06-09 NOTE — Progress Notes (Signed)
By signing my name below, I, Alison Young, attest that this documentation has been prepared under the direction and in the presence of Alison Staggers, MD.  Electronically Signed: Arvilla Young, Medical Scribe. 06/09/16. 2:32 PM.  Subjective:    Patient ID: Alison Young, female    DOB: 01-15-1991, 25 y.o.   MRN: [id number]  HPI Chief Complaint  Patient presents with  . Sinus Problem    x 5 days    HPI Comments: Alison Young is a 25 y.o. female who presents to the [facility] complaining of sinus congestion with green and brown sputum onset 5 days. Reports associated symptoms of sore throat, facial pressure, coughing, myalgias, and mentions the color change in her sputum started 2 days ago. Took night time sinex without relief to her symptoms. She had a sinus infection 2 months ago that last for 3 days before she took zpac for relief; pt has 3-4 sinus infections a year. Her mom currently has a sinus infection and has been on abx. Pt has asthma but she hasn't needed her inhaler, and takes xyzal daily for her allergies. Denies trouble breathing.   Patient Active Problem List   Diagnosis Date Noted  . Reactive airway disease 03/26/2014  . ADHD (attention deficit hyperactivity disorder) 03/26/2014  . Rapid palpitations 04/07/2013  . Dizziness 04/07/2013  . 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 Allergic rhinitis   . Asthma   . Bundle branch block, right   . Pneumothorax    Past Surgical History:  Procedure Laterality Date  . CHEST TUBE INSERTION    . PLEURADESIS Right 09/15/2013   Procedure: PLEURADESIS with TALC APPLICATION;  Surgeon: [name], MD;  Location: [facility] OR;  Service: Thoracic;  Laterality: Right;  Marland Kitchen VIDEO ASSISTED THORACOSCOPY Right 09/15/2013   Procedure: VIDEO ASSISTED THORACOSCOPY;  Surgeon: [name], MD;  Location: [facility] OR;  Service: Thoracic;  Laterality: Right;  with stapling  of blebs  . WRIST SURGERY     Left   No Known Allergies Prior to Admission medications   Medication Sig Start Date End Date Taking? Authorizing Provider  albuterol (PROVENTIL HFA;VENTOLIN HFA) 108 (90 BASE) MCG/ACT inhaler Inhale 2 puffs into the lungs every 6 (six) hours as needed. SHORTNESS OF BREATH   Yes Historical Provider, MD  aspirin-acetaminophen-caffeine (EXCEDRIN MIGRAINE) [id number] MG per tablet Take 1 tablet by mouth every 6 (six) hours as needed. HEADACHE   Yes Historical Provider, MD  cefdinir (OMNICEF) 300 MG capsule Take 1 capsule (300 mg total) by mouth 2 (two) times daily. 03/26/14  Yes Alison Sidle, MD  dexmethylphenidate (FOCALIN) 10 MG tablet Take 10 mg by mouth 2 (two) times daily as needed (ADD).    Yes Historical Provider, MD  drospirenone-ethinyl estradiol (YASMIN,ZARAH,SYEDA) 3-0.03 MG tablet Take 1 tablet by mouth daily. 08/10/12  Yes Alison E Debbra Riding, PA-C  EPIPEN 2-PAK 0.3 MG/0.3ML SOAJ injection 0.3 mg. 03/24/13  Yes Historical Provider, MD  levocetirizine (XYZAL) 5 MG tablet Take 5 mg by mouth daily.     Yes Historical Provider, MD  montelukast (SINGULAIR) 10 MG tablet Take 10 mg by mouth daily.     Yes Historical Provider, MD   Social History   Social History  . Marital status: Single    Spouse name: [name]  . Number of children: 0  . Years of education: [name]   Occupational History  . student    Social History Main Topics  .  Smoking status: Never Smoker  . Smokeless tobacco: Never Used  . Alcohol use Yes  . Drug use: No  . Sexual activity: Yes    Birth control/ protection: Pill   Other Topics Concern  . Not on file   Social History Narrative  . No narrative on file   Review of Systems  HENT: Positive for congestion, sinus pressure and sore throat.   Respiratory: Positive for cough. Negative for shortness of breath and wheezing.   Musculoskeletal: Positive for myalgias.  Allergic/Immunologic: Positive for environmental allergies.   Objective:    Physical Exam  Constitutional: She is oriented to person, place, and time. She appears well-developed and well-nourished. No distress.  HENT:  Head: Normocephalic and atraumatic.  Right Ear: Hearing, tympanic membrane, external ear and ear canal normal.  Left Ear: Hearing, tympanic membrane, external ear and ear canal normal.  Nose: Nose normal. Right sinus exhibits no maxillary sinus tenderness and no frontal sinus tenderness. Left sinus exhibits no maxillary sinus tenderness and no frontal sinus tenderness.  Mouth/Throat: Oropharynx is clear and moist. No oropharyngeal exudate.  No focal sinus tenderness  Eyes: Conjunctivae and EOM are normal. Pupils are equal, round, and reactive to light.  Cardiovascular: Normal rate, regular rhythm, normal heart sounds and intact distal pulses.  Exam reveals no friction rub.   No murmur heard. Pulmonary/Chest: Effort normal and breath sounds normal. No respiratory distress. She has no decreased breath sounds. She has no wheezes. She has no rhonchi. She has no rales.  Neurological: She is alert and oriented to person, place, and time.  Skin: Skin is warm and dry. No rash noted.  Psychiatric: She has a normal mood and affect. Her behavior is normal.  Vitals reviewed.  BP 112/76 (BP Location: Right Arm, Patient Position: Sitting, Cuff Size: Normal)   Pulse 95   Temp 98.1 F (36.7 C) (Oral)   Resp 16   Ht 5\' 7"  (1.702 m)   Wt 130 lb (59 kg)   LMP 05/28/2016 (Approximate)   SpO2 99%   BMI 20.36 kg/m  Assessment & Plan:    Alison Young is a 25 y.o. female Acute upper respiratory infection - Plan: azithromycin (ZITHROMAX) 250 MG tablet  History of sinusitis  Sinus pressure  Based on current symptoms and timing as well as exam, suspected viral illness. Discussed appropriate use of antibiotics as well as typical signs and symptoms of sinus infections. Did agree to prescribe azithromycin as she has used this with success for her sinuses  infections in the past, but again discussed appropriate timing of use and current symptoms appearing to be viral. Symptomatic care discussed for current symptoms and RTC precautions given, including if any persistent symptoms after taking azithromycin for a sinus infection as possible resistance.  Meds ordered this encounter  Medications  . azithromycin (ZITHROMAX) 250 MG tablet    Sig: Take 2 pills by mouth on day 1, then 1 pill by mouth per day on days 2 through 5.    Dispense:  6 tablet    Refill:  0   Patient Instructions    As we discussed I do not think your sinus congestion is due to infection from a bacteria at this time. Try saline nasal spray, see other information below on upper respiratory infections, make sure you drink plenty of fluids and rest as needed. If your sinus pressure including discolored nasal discharge is not improving within the next week, or you have secondary sickening or worsening as we discussed,  you can start the antibiotic as prescribed.   I would usually recommend Augmentin to treat sinus infections, but based on your description of problems with amoxicillin, I did write azithromycin at this time. If you do fill that prescription, and have fevers, or worsening symptoms while you are taking that antibiotic, return for recheck.  Return to the clinic or go to the nearest emergency room if any of your symptoms worsen or new symptoms occur.   Upper Respiratory Infection, Adult Most upper respiratory infections (URIs) are a viral infection of the air passages leading to the lungs. A URI affects the nose, throat, and upper air passages. The most common type of URI is nasopharyngitis and is typically referred to as "the common cold." URIs run their course and usually go away on their own. Most of the time, a URI does not require medical attention, but sometimes a bacterial infection in the upper airways can follow a viral infection. This is called a secondary infection.  Sinus and middle ear infections are common types of secondary upper respiratory infections. Bacterial pneumonia can also complicate a URI. A URI can worsen asthma and chronic obstructive pulmonary disease (COPD). Sometimes, these complications can require emergency medical care and may be life threatening. What are the causes? Almost all URIs are caused by viruses. A virus is a type of germ and can spread from one person to another. What increases the risk? You may be at risk for a URI if:  You smoke.  You have chronic heart or lung disease.  You have a weakened defense (immune) system.  You are very young or very old.  You have nasal allergies or asthma.  You work in crowded or poorly ventilated areas.  You work in health care facilities or schools. What are the signs or symptoms? Symptoms typically develop 2-3 days after you come in contact with a cold virus. Most viral URIs last 7-10 days. However, viral URIs from the influenza virus (flu virus) can last 14-18 days and are typically more severe. Symptoms may include:  Runny or stuffy (congested) nose.  Sneezing.  Cough.  Sore throat.  Headache.  Fatigue.  Fever.  Loss of appetite.  Pain in your forehead, behind your eyes, and over your cheekbones (sinus pain).  Muscle aches. How is this diagnosed? Your health care provider may diagnose a URI by:  Physical exam.  Tests to check that your symptoms are not due to another condition such as:  Strep throat.  Sinusitis.  Pneumonia.  Asthma. How is this treated? A URI goes away on its own with time. It cannot be cured with medicines, but medicines may be prescribed or recommended to relieve symptoms. Medicines may help:  Reduce your fever.  Reduce your cough.  Relieve nasal congestion. Follow these instructions at home:  Take medicines only as directed by your health care provider.  Gargle warm saltwater or take cough drops to comfort your throat as  directed by your health care provider.  Use a warm mist humidifier or inhale steam from a shower to increase air moisture. This may make it easier to breathe.  Drink enough fluid to keep your urine clear or pale yellow.  Eat soups and other clear broths and maintain good nutrition.  Rest as needed.  Return to work when your temperature has returned to normal or as your health care provider advises. You may need to stay home longer to avoid infecting others. You can also use a face mask and careful  hand washing to prevent spread of the virus.  Increase the usage of your inhaler if you have asthma.  Do not use any tobacco products, including cigarettes, chewing tobacco, or electronic cigarettes. If you need help quitting, ask your health care provider. How is this prevented? The best way to protect yourself from getting a cold is to practice good hygiene.  Avoid oral or hand contact with people with cold symptoms.  Wash your hands often if contact occurs. There is no clear evidence that vitamin C, vitamin E, echinacea, or exercise reduces the chance of developing a cold. However, it is always recommended to get plenty of rest, exercise, and practice good nutrition. Contact a health care provider if:  You are getting worse rather than better.  Your symptoms are not controlled by medicine.  You have chills.  You have worsening shortness of breath.  You have brown or red mucus.  You have yellow or brown nasal discharge.  You have pain in your face, especially when you bend forward.  You have a fever.  You have swollen neck glands.  You have pain while swallowing.  You have white areas in the back of your throat. Get help right away if:  You have severe or persistent:  Headache.  Ear pain.  Sinus pain.  Chest pain.  You have chronic lung disease and any of the following:  Wheezing.  Prolonged cough.  Coughing up blood.  A change in your usual mucus.  You  have a stiff neck.  You have changes in your:  Vision.  Hearing.  Thinking.  Mood. This information is not intended to replace advice given to you by your health care provider. Make sure you discuss any questions you have with your health care provider. Document Released: 12/30/2000 Document Revised: 03/08/2016 Document Reviewed: 10/11/2013 Elsevier Interactive Patient Education  2017 [employer].   IF you received an x-ray today, you will receive an invoice from [facility]. Please contact [facility] at [phone] with questions or concerns regarding your invoice.   IF you received labwork today, you will receive an invoice from [employer] [facility]. Please contact [facility] at [phone] with questions or concerns regarding your invoice.   Our billing staff will not be able to assist you with questions regarding bills from these companies.  You will be contacted with the lab results as soon as they are available. The fastest way to get your results is to activate your My Chart account. Instructions are located on the last page of this paperwork. If you have not heard from [place] regarding the results in 2 weeks, please contact this office.        I personally performed the services described in this documentation, which was scribed in my presence. The recorded information has been reviewed and considered, and addended by me as needed.   Signed,   Alison Staggers, MD [facility].  06/10/16 4:04 PM

## 2016-06-09 NOTE — Patient Instructions (Addendum)
As we discussed I do not think your sinus congestion is due to infection from a bacteria at this time. Try saline nasal spray, see other information below on upper respiratory infections, make sure you drink plenty of fluids and rest as needed. If your sinus pressure including discolored nasal discharge is not improving within the next week, or you have secondary sickening or worsening as we discussed, you can start the antibiotic as prescribed.   I would usually recommend Augmentin to treat sinus infections, but based on your description of problems with amoxicillin, I did write azithromycin at this time. If you do fill that prescription, and have fevers, or worsening symptoms while you are taking that antibiotic, return for recheck.  Return to the clinic or go to the nearest emergency room if any of your symptoms worsen or new symptoms occur.   Upper Respiratory Infection, Adult Most upper respiratory infections (URIs) are a viral infection of the air passages leading to the lungs. A URI affects the nose, throat, and upper air passages. The most common type of URI is nasopharyngitis and is typically referred to as "the common cold." URIs run their course and usually go away on their own. Most of the time, a URI does not require medical attention, but sometimes a bacterial infection in the upper airways can follow a viral infection. This is called a secondary infection. Sinus and middle ear infections are common types of secondary upper respiratory infections. Bacterial pneumonia can also complicate a URI. A URI can worsen asthma and chronic obstructive pulmonary disease (COPD). Sometimes, these complications can require emergency medical care and may be life threatening. What are the causes? Almost all URIs are caused by viruses. A virus is a type of germ and can spread from one person to another. What increases the risk? You may be at risk for a URI if:  You smoke.  You have chronic heart or  lung disease.  You have a weakened defense (immune) system.  You are very young or very old.  You have nasal allergies or asthma.  You work in crowded or poorly ventilated areas.  You work in health care facilities or schools. What are the signs or symptoms? Symptoms typically develop 2-3 days after you come in contact with a cold virus. Most viral URIs last 7-10 days. However, viral URIs from the influenza virus (flu virus) can last 14-18 days and are typically more severe. Symptoms may include:  Runny or stuffy (congested) nose.  Sneezing.  Cough.  Sore throat.  Headache.  Fatigue.  Fever.  Loss of appetite.  Pain in your forehead, behind your eyes, and over your cheekbones (sinus pain).  Muscle aches. How is this diagnosed? Your health care provider may diagnose a URI by:  Physical exam.  Tests to check that your symptoms are not due to another condition such as:  Strep throat.  Sinusitis.  Pneumonia.  Asthma. How is this treated? A URI goes away on its own with time. It cannot be cured with medicines, but medicines may be prescribed or recommended to relieve symptoms. Medicines may help:  Reduce your fever.  Reduce your cough.  Relieve nasal congestion. Follow these instructions at home:  Take medicines only as directed by your health care provider.  Gargle warm saltwater or take cough drops to comfort your throat as directed by your health care provider.  Use a warm mist humidifier or inhale steam from a shower to increase air moisture. This may make it easier  to breathe.  Drink enough fluid to keep your urine clear or pale yellow.  Eat soups and other clear broths and maintain good nutrition.  Rest as needed.  Return to work when your temperature has returned to normal or as your health care provider advises. You may need to stay home longer to avoid infecting others. You can also use a face mask and careful hand washing to prevent spread of  the virus.  Increase the usage of your inhaler if you have asthma.  Do not use any tobacco products, including cigarettes, chewing tobacco, or electronic cigarettes. If you need help quitting, ask your health care provider. How is this prevented? The best way to protect yourself from getting a cold is to practice good hygiene.  Avoid oral or hand contact with people with cold symptoms.  Wash your hands often if contact occurs. There is no clear evidence that vitamin C, vitamin E, echinacea, or exercise reduces the chance of developing a cold. However, it is always recommended to get plenty of rest, exercise, and practice good nutrition. Contact a health care provider if:  You are getting worse rather than better.  Your symptoms are not controlled by medicine.  You have chills.  You have worsening shortness of breath.  You have brown or red mucus.  You have yellow or brown nasal discharge.  You have pain in your face, especially when you bend forward.  You have a fever.  You have swollen neck glands.  You have pain while swallowing.  You have white areas in the back of your throat. Get help right away if:  You have severe or persistent:  Headache.  Ear pain.  Sinus pain.  Chest pain.  You have chronic lung disease and any of the following:  Wheezing.  Prolonged cough.  Coughing up blood.  A change in your usual mucus.  You have a stiff neck.  You have changes in your:  Vision.  Hearing.  Thinking.  Mood. This information is not intended to replace advice given to you by your health care provider. Make sure you discuss any questions you have with your health care provider. Document Released: 12/30/2000 Document Revised: 03/08/2016 Document Reviewed: 10/11/2013 Elsevier Interactive Patient Education  2017 [employer]   IF you received an x-ray today, you will receive an invoice from [facility]. Please contact [facility] at  [phone] with questions or concerns regarding your invoice.   IF you received labwork today, you will receive an invoice from [facility]. Please contact [facility] at [phone] with questions or concerns regarding your invoice.   Our billing staff will not be able to assist you with questions regarding bills from these companies.  You will be contacted with the lab results as soon as they are available. The fastest way to get your results is to activate your My Chart account. Instructions are located on the last page of this paperwork. If you have not heard from us regarding the results in 2 weeks, please contact this office.

## 2016-09-02 ENCOUNTER — Emergency Department (HOSPITAL_COMMUNITY)
Admission: EM | Admit: 2016-09-02 | Discharge: 2016-09-02 | Disposition: A | Discharge status: Home / Self Care | Payer: BLUE CROSS/BLUE SHIELD | Attending: Emergency Medicine | Admitting: Emergency Medicine

## 2016-09-02 ENCOUNTER — Encounter (HOSPITAL_COMMUNITY): Payer: Self-pay | Admitting: Emergency Medicine

## 2016-09-02 DIAGNOSIS — K219 Gastro-esophageal reflux disease without esophagitis: Secondary | ICD-10-CM | POA: Insufficient documentation

## 2016-09-02 DIAGNOSIS — R1013 Epigastric pain: Secondary | ICD-10-CM

## 2016-09-02 DIAGNOSIS — F909 Attention-deficit hyperactivity disorder, unspecified type: Secondary | ICD-10-CM | POA: Insufficient documentation

## 2016-09-02 DIAGNOSIS — J45909 Unspecified asthma, uncomplicated: Secondary | ICD-10-CM | POA: Insufficient documentation

## 2016-09-02 DIAGNOSIS — K2960 Other gastritis without bleeding: Secondary | ICD-10-CM

## 2016-09-02 LAB — CBC WITH DIFFERENTIAL/PLATELET
Basophils Absolute: 0 10*3/uL (ref 0.0–0.1)
Basophils Relative: 0 %
EOS ABS: 0 10*3/uL (ref 0.0–0.7)
Eosinophils Relative: 0 %
HCT: 42.8 % (ref 36.0–46.0)
HEMOGLOBIN: 14.1 g/dL (ref 12.0–15.0)
LYMPHS ABS: 0.4 10*3/uL — AB (ref 0.7–4.0)
Lymphocytes Relative: 4 %
MCH: 29.7 pg (ref 26.0–34.0)
MCHC: 32.9 g/dL (ref 30.0–36.0)
MCV: 90.1 fL (ref 78.0–100.0)
MONOS PCT: 6 %
Monocytes Absolute: 0.7 10*3/uL (ref 0.1–1.0)
Neutro Abs: 10.6 10*3/uL — ABNORMAL HIGH (ref 1.7–7.7)
Neutrophils Relative %: 90 %
Platelets: 181 10*3/uL (ref 150–400)
RBC: 4.75 MIL/uL (ref 3.87–5.11)
RDW: 12.7 % (ref 11.5–15.5)
WBC: 11.7 10*3/uL — ABNORMAL HIGH (ref 4.0–10.5)

## 2016-09-02 LAB — COMPREHENSIVE METABOLIC PANEL
ALT: 16 U/L (ref 14–54)
AST: 21 U/L (ref 15–41)
Albumin: 4.2 g/dL (ref 3.5–5.0)
Alkaline Phosphatase: 30 U/L — ABNORMAL LOW (ref 38–126)
Anion gap: 14 (ref 5–15)
BUN: 15 mg/dL (ref 6–20)
CO2: 23 mmol/L (ref 22–32)
Calcium: 9.2 mg/dL (ref 8.9–10.3)
Chloride: 103 mmol/L (ref 101–111)
Creatinine, Ser: 0.74 mg/dL (ref 0.44–1.00)
GFR calc Af Amer: >60 mL/min (ref >60)
GFR calc non Af Amer: >60 mL/min (ref >60)
Glucose, Bld: 143 mg/dL — ABNORMAL HIGH (ref 65–99)
Potassium: 3.6 mmol/L (ref 3.5–5.1)
Sodium: 140 mmol/L (ref 135–145)
TOTAL PROTEIN: 7.2 g/dL (ref 6.5–8.1)
Total Bilirubin: 0.7 mg/dL (ref 0.3–1.2)

## 2016-09-02 LAB — ACETAMINOPHEN LEVEL: Acetaminophen (Tylenol), Serum: <10 ug/mL — ABNORMAL LOW (ref 10–30)

## 2016-09-02 LAB — I-STAT BETA HCG BLOOD, ED (MC, WL, AP ONLY): I-stat hCG, quantitative: <5 m[IU]/mL (ref <5)

## 2016-09-02 LAB — LIPASE, BLOOD: LIPASE: 20 U/L (ref 11–51)

## 2016-09-02 LAB — I-STAT CG4 LACTIC ACID, ED: Lactic Acid, Venous: 1.6 mmol/L (ref 0.5–1.9)

## 2016-09-02 MED ORDER — MORPHINE SULFATE (PF) 4 MG/ML IV SOLN
4.0000 mg | Freq: Once | INTRAVENOUS | Status: AC
  Administered 2016-09-02: 4 mg via INTRAVENOUS
  Filled 2016-09-02: qty 1

## 2016-09-02 MED ORDER — FAMOTIDINE IN NACL 20-0.9 MG/50ML-% IV SOLN
20.0000 mg | Freq: Once | INTRAVENOUS | Status: AC
  Administered 2016-09-02: 20 mg via INTRAVENOUS
  Filled 2016-09-02: qty 50

## 2016-09-02 MED ORDER — SUCRALFATE 1 GM/10ML PO SUSP: 1.0000 g | Freq: Three times a day (TID) | ORAL | 0 refills | Status: DC

## 2016-09-02 MED ORDER — SODIUM CHLORIDE 0.9 % IV BOLUS (SEPSIS)
2000.0000 mL | Freq: Once | INTRAVENOUS | Status: AC
  Administered 2016-09-02: 2000 mL via INTRAVENOUS

## 2016-09-02 MED ORDER — FAMOTIDINE 20 MG PO TABS: 20.0000 mg | ORAL_TABLET | Freq: Two times a day (BID) | ORAL | 0 refills | Status: DC

## 2016-09-02 MED ORDER — ONDANSETRON 4 MG PO TBDP: ORAL_TABLET | ORAL | 0 refills | Status: DC

## 2016-09-02 MED ORDER — ONDANSETRON HCL 4 MG/2ML IJ SOLN
4.0000 mg | Freq: Once | INTRAMUSCULAR | Status: AC
  Administered 2016-09-02: 4 mg via INTRAVENOUS
  Filled 2016-09-02: qty 2

## 2016-09-02 MED ORDER — GI COCKTAIL ~~LOC~~
30.0000 mL | Freq: Once | ORAL | Status: AC
  Administered 2016-09-02: 30 mL via ORAL
  Filled 2016-09-02: qty 30

## 2016-09-02 NOTE — ED Notes (Signed)
Pt ambulated to the bathroom but was unable to urinate at this time.

## 2016-09-02 NOTE — ED Notes (Signed)
Pt unable to void at this time. 

## 2016-09-02 NOTE — ED Triage Notes (Signed)
Per pt started with abdominal pain and "no appetite" on Tuesday.  Started vomiting "violently" about 9:30 last night and has vomited approximately 7 times since then. Pt states she had a broken coccyx earlier this year and has been taking a lot of tylenol and naproxen at home and when this started bothering her stomach she stopped taking it.

## 2016-09-02 NOTE — ED Provider Notes (Signed)
[facility]-EMERGENCY DEPT Provider Note   CSN: [id number] Arrival date & time: 09/02/16  0544     History   Chief Complaint Chief Complaint  Patient presents with  . Emesis  . Abdominal Pain    HPI Alison Young is a 26 y.o. female complaining of epigastric abdominal pain onset 1 week ago. She started having 7 episodes of violence, nonbloody, nonbilious, no coffee-ground emesis onset last night. She states that the pain is exacerbated postprandially, she rates it at 8 out of 10 right now, 10 out of 10 at worst. She notes darker than normal stool. She recently broke her coccyx in January and she's been taking 6 500 mg acetaminophen daily with 3 naproxen. She's not combining it with any other acetaminophen-containing medications she's not taking any other over-the-counter medications, she was prescribed Norco however she's not taking that. She also has been having a glass or 2 of wine a day. She denies diarrhea, dysuria, hematuria, abnormal vaginal discharge, fever, chills.  HPI  Past Medical History:  Diagnosis Date  . Allergic rhinitis   . Asthma   . Bundle branch block, right   . Pneumothorax     Patient Active Problem List   Diagnosis Date Noted  . Reactive airway disease 03/26/2014  . ADHD (attention deficit hyperactivity disorder) 03/26/2014  . Rapid palpitations 04/07/2013  . Dizziness 04/07/2013  . Recurrent spontaneous pneumothorax 04/17/2011    Past Surgical History:  Procedure Laterality Date  . CHEST TUBE INSERTION    . PLEURADESIS Right 09/15/2013   Procedure: PLEURADESIS with TALC APPLICATION;  Surgeon: [name], MD;  Location: [facility] OR;  Service: Thoracic;  Laterality: Right;  Marland Kitchen VIDEO ASSISTED THORACOSCOPY Right 09/15/2013   Procedure: VIDEO ASSISTED THORACOSCOPY;  Surgeon: [name], MD;  Location: [facility] OR;  Service: Thoracic;  Laterality: Right;  with stapling of blebs  . WRIST SURGERY     Left    OB History    No data available       Home  Medications    Prior to Admission medications   Medication Sig Start Date End Date Taking? Authorizing Provider  levocetirizine (XYZAL) 5 MG tablet Take 5 mg by mouth daily.     Yes Historical Provider, MD  montelukast (SINGULAIR) 10 MG tablet Take 10 mg by mouth daily.     Yes Historical Provider, MD  azithromycin (ZITHROMAX) 250 MG tablet Take 2 pills by mouth on day 1, then 1 pill by mouth per day on days 2 through 5. Patient not taking: Reported on 09/02/2016 06/09/16   Shade Flood, MD  drospirenone-ethinyl estradiol (YASMIN,ZARAH,SYEDA) 3-0.03 MG tablet Take 1 tablet by mouth daily. Patient not taking: Reported on 09/02/2016 08/10/12   Godfrey Pick, PA-C  famotidine (PEPCID) 20 MG tablet Take 1 tablet (20 mg total) by mouth 2 (two) times daily. 09/02/16   Paislei Dorval, PA-C  ondansetron (ZOFRAN ODT) 4 MG disintegrating tablet 4mg  ODT q4 hours prn nausea/vomit 09/02/16   Evaline Waltman, PA-C  sucralfate (CARAFATE) 1 GM/10ML suspension Take 10 mLs (1 g total) by mouth 4 (four) times daily -  with meals and at bedtime. 09/02/16   Joni Reining Alexyss Balzarini, PA-C    Family History Family History  Problem Relation Age of Onset  . Allergies Mother   . Asthma Mother   . Heart disease Mother     valve repair  . Allergies Father   . Allergies Sister   . Asthma Sister   . Breast cancer Maternal Grandmother   .  Cervical cancer Maternal Grandmother     Social History Social History  Substance Use Topics  . Smoking status: Never Smoker  . Smokeless tobacco: Never Used  . Alcohol use Yes     Allergies   Patient has no known allergies.   Review of Systems Review of Systems  10 systems reviewed and found to be negative, except as noted in the HPI.   Physical Exam Updated Vital Signs BP 112/65   Pulse 93   Temp 99 F (37.2 C) (Oral)   Resp 16   Ht 5\' 7"  (1.702 m)   Wt 60.3 kg   LMP 08/19/2016 (Exact Date)   SpO2 99%   BMI 20.83 kg/m   Physical Exam  Constitutional:  She is oriented to person, place, and time. She appears well-developed and well-nourished. No distress.  HENT:  Head: Normocephalic and atraumatic.  Mouth/Throat: Oropharynx is clear and moist.  Eyes: Conjunctivae and EOM are normal. Pupils are equal, round, and reactive to light.  Neck: Normal range of motion.  Cardiovascular: Regular rhythm and intact distal pulses.   Tachycardic in the 130s, regular no murmurs  Pulmonary/Chest: Effort normal and breath sounds normal.  Abdominal: Soft. She exhibits no distension and no mass. There is tenderness. There is no rebound and no guarding. No hernia.  Normal active bowel sounds, mild tenderness palpation the epigastrium, negative Murphy's sign, no tenderness palpation the right lower quadrant.  Musculoskeletal: Normal range of motion.  Neurological: She is alert and oriented to person, place, and time.  Skin: She is not diaphoretic.  Psychiatric: She has a normal mood and affect.  Nursing note and vitals reviewed.    ED Treatments / Results  Labs (all labs ordered are listed, but only abnormal results are displayed) Labs Reviewed  CBC WITH DIFFERENTIAL/PLATELET - Abnormal; Notable for the following:       Result Value   WBC 11.7 (*)    Neutro Abs 10.6 (*)    Lymphs Abs 0.4 (*)    All other components within normal limits  COMPREHENSIVE METABOLIC PANEL - Abnormal; Notable for the following:    Glucose, Bld 143 (*)    Alkaline Phosphatase 30 (*)    All other components within normal limits  ACETAMINOPHEN LEVEL - Abnormal; Notable for the following:    Acetaminophen (Tylenol), Serum <10 (*)    All other components within normal limits  LIPASE, BLOOD  I-STAT BETA HCG BLOOD, ED ([facility], [facility], [facility] ONLY)  I-STAT CG4 LACTIC ACID, ED    EKG  EKG Interpretation None       Radiology No results found.  Procedures Procedures (including critical care time)  Medications Ordered in ED Medications  sodium chloride 0.9 % bolus 2,000 mL (0  mLs Intravenous Stopped 09/02/16 0946)  ondansetron (ZOFRAN) injection 4 mg (4 mg Intravenous Given 09/02/16 0725)  famotidine (PEPCID) IVPB 20 mg premix (0 mg Intravenous Stopped 09/02/16 0812)  morphine 4 MG/ML injection 4 mg (4 mg Intravenous Given 09/02/16 0734)  sodium chloride 0.9 % bolus 2,000 mL (2,000 mLs Intravenous New Bag/Given 09/02/16 0946)  gi cocktail (Maalox,Lidocaine,Donnatal) (30 mLs Oral Given 09/02/16 0946)     Initial Impression / Assessment and Plan / ED Course  I have reviewed the triage vital signs and the nursing notes.  Pertinent labs & imaging results that were available during my care of the patient were reviewed by me and considered in my medical decision making (see chart for details).     Vitals:  09/02/16 0745 09/02/16 0815 09/02/16 0900 09/02/16 0915  BP: 102/71 105/68 100/62 112/65  Pulse: 94 91 93 93  Resp: 16   16  Temp:      TempSrc:      SpO2: 99% 99% 99% 99%  Weight:      Height:        Medications  sodium chloride 0.9 % bolus 2,000 mL (0 mLs Intravenous Stopped 09/02/16 0946)  ondansetron (ZOFRAN) injection 4 mg (4 mg Intravenous Given 09/02/16 0725)  famotidine (PEPCID) IVPB 20 mg premix (0 mg Intravenous Stopped 09/02/16 0812)  morphine 4 MG/ML injection 4 mg (4 mg Intravenous Given 09/02/16 0734)  sodium chloride 0.9 % bolus 2,000 mL (2,000 mLs Intravenous New Bag/Given 09/02/16 0946)  gi cocktail (Maalox,Lidocaine,Donnatal) (30 mLs Oral Given 09/02/16 0946)    Alison Young is 26 y.o. female presenting with Presenting with epigastric pain, decreased by mouth intake and multiple episodes of emesis starting yesterday. Emesis is nonbloody and nonbilious however she is reporting darker than normal stool, I have advised her a fecal occult blood test would be helpful however patient declines, I think this is reasonable. This is likely peptic ulcer disease. She has an appointment set up with her primary care doctor in a few weeks. She feels much  better after hydration but is still not producing urine, will give her 2 more liters and ambulate her to make sure she is steady on her feet. Will start her on Pepcid and carafte, advised her she may need to be tested for H. Pylori.  Patient tolerating by mouth's, repeat abdominal exam is benign. Ambulating with a steady gait and appropriate for discharge.  Evaluation does not show pathology that would require ongoing emergent intervention or inpatient treatment. Pt is hemodynamically stable and mentating appropriately. Discussed findings and plan with patient/guardian, who agrees with care plan. All questions answered. Return precautions discussed and outpatient follow up given.    Final Clinical Impressions(s) / ED Diagnoses   Final diagnoses:  Epigastric pain  Reflux gastritis    New Prescriptions New Prescriptions   FAMOTIDINE (PEPCID) 20 MG TABLET    Take 1 tablet (20 mg total) by mouth 2 (two) times daily.   ONDANSETRON (ZOFRAN ODT) 4 MG DISINTEGRATING TABLET    4mg  ODT q4 hours prn nausea/vomit   SUCRALFATE (CARAFATE) 1 GM/10ML SUSPENSION    Take 10 mLs (1 g total) by mouth 4 (four) times daily -  with meals and at bedtime.     Wynetta Emeryicole Levy Cedano, PA-C 09/02/16 1126    Jacalyn LefevreJulie Haviland, MD 09/02/16 1309

## 2016-09-02 NOTE — Discharge Instructions (Signed)
Do not take any NSAIDs (motrin, ibuprofen, advil, aleve, excedrin, aspirin, naproxen, goody's powder,  etc.) because they can further irritate your stomach.  ° °Please follow with your primary care doctor in the next 2 days for a check-up. They must obtain records for further management.  ° °Do not hesitate to return to the Emergency Department for any new, worsening or concerning symptoms.  ° ° °

## 2016-09-02 NOTE — ED Notes (Signed)
pt still unable to void at this time.

## 2016-10-05 ENCOUNTER — Other Ambulatory Visit: Payer: Self-pay | Admitting: Family Medicine

## 2016-10-05 ENCOUNTER — Other Ambulatory Visit (HOSPITAL_COMMUNITY)
Admission: RE | Admit: 2016-10-05 | Discharge: 2016-10-05 | Disposition: A | Discharge status: Home / Self Care | Payer: BLUE CROSS/BLUE SHIELD | Source: Ambulatory Visit | Attending: Family Medicine | Admitting: Family Medicine

## 2016-10-05 DIAGNOSIS — Z01411 Encounter for gynecological examination (general) (routine) with abnormal findings: Secondary | ICD-10-CM | POA: Insufficient documentation

## 2016-10-05 DIAGNOSIS — Z1151 Encounter for screening for human papillomavirus (HPV): Secondary | ICD-10-CM | POA: Insufficient documentation

## 2016-10-08 LAB — CYTOLOGY - PAP
Diagnosis: NEGATIVE
HPV (WINDOPATH): NOT DETECTED

## 2017-02-15 ENCOUNTER — Encounter: Payer: Self-pay | Admitting: Physician Assistant

## 2017-02-15 ENCOUNTER — Ambulatory Visit (INDEPENDENT_AMBULATORY_CARE_PROVIDER_SITE_OTHER): Payer: BLUE CROSS/BLUE SHIELD | Admitting: Physician Assistant

## 2017-02-15 VITALS — BP 102/69 | HR 90 | Temp 98.2°F | Resp 16 | Ht 67.0 in | Wt 134.0 lb

## 2017-02-15 DIAGNOSIS — R300 Dysuria: Secondary | ICD-10-CM | POA: Diagnosis not present

## 2017-02-15 DIAGNOSIS — Z7251 High risk heterosexual behavior: Secondary | ICD-10-CM | POA: Diagnosis not present

## 2017-02-15 DIAGNOSIS — L298 Other pruritus: Secondary | ICD-10-CM | POA: Diagnosis not present

## 2017-02-15 DIAGNOSIS — Z113 Encounter for screening for infections with a predominantly sexual mode of transmission: Secondary | ICD-10-CM

## 2017-02-15 DIAGNOSIS — N898 Other specified noninflammatory disorders of vagina: Secondary | ICD-10-CM

## 2017-02-15 LAB — POCT URINALYSIS DIP (MANUAL ENTRY)
Bilirubin, UA: NEGATIVE
Glucose, UA: NEGATIVE mg/dL
NITRITE UA: POSITIVE — AB
PH UA: 7 (ref 5.0–8.0)
PROTEIN UA: NEGATIVE mg/dL
Spec Grav, UA: 1.015 (ref 1.010–1.025)
Urobilinogen, UA: 0.2 E.U./dL

## 2017-02-15 LAB — POCT WET + KOH PREP
Trich by wet prep: ABSENT
YEAST BY WET PREP: ABSENT
Yeast by KOH: ABSENT

## 2017-02-15 LAB — POCT URINE PREGNANCY: Preg Test, Ur: NEGATIVE

## 2017-02-15 MED ORDER — NITROFURANTOIN MONOHYD MACRO 100 MG PO CAPS: 100.0000 mg | ORAL_CAPSULE | Freq: Two times a day (BID) | ORAL | 0 refills | Status: AC

## 2017-02-15 NOTE — Patient Instructions (Addendum)
You can use tylenol or ibuprofen for pain or fever.   Please hydrate well with water, 64oz of water.     Urinary Tract Infection, Adult A urinary tract infection (UTI) is an infection of any part of the urinary tract. The urinary tract includes the:  Kidneys.  Ureters.  Bladder.  Urethra.  These organs make, store, and get rid of pee (urine) in the body. Follow these instructions at home:  Take over-the-counter and prescription medicines only as told by your doctor.  If you were prescribed an antibiotic medicine, take it as told by your doctor. Do not stop taking the antibiotic even if you start to feel better.  Avoid the following drinks: ? Alcohol. ? Caffeine. ? Tea. ? Carbonated drinks.  Drink enough fluid to keep your pee clear or pale yellow.  Keep all follow-up visits as told by your doctor. This is important.  Make sure to: ? Empty your bladder often and completely. Do not to hold pee for long periods of time. ? Empty your bladder before and after sex. ? Wipe from front to back after a bowel movement if you are female. Use each tissue one time when you wipe. Contact a doctor if:  You have back pain.  You have a fever.  You feel sick to your stomach (nauseous).  You throw up (vomit).  Your symptoms do not get better after 3 days.  Your symptoms go away and then come back. Get help right away if:  You have very bad back pain.  You have very bad lower belly (abdominal) pain.  You are throwing up and cannot keep down any medicines or water. This information is not intended to replace advice given to you by your health care provider. Make sure you discuss any questions you have with your health care provider. Document Released: 12/23/2007 Document Revised: 12/12/2015 Document Reviewed: 05/27/2015 Elsevier Interactive Patient Education  2018 [employer]     IF you received an x-ray today, you will receive an invoice from [facility]. Please  contact [facility] at [phone] with questions or concerns regarding your invoice.   IF you received labwork today, you will receive an invoice from [facility]. Please contact [facility] at [phone] with questions or concerns regarding your invoice.   Our billing staff will not be able to assist you with questions regarding bills from these companies.  You will be contacted with the lab results as soon as they are available. The fastest way to get your results is to activate your My Chart account. Instructions are located on the last page of this paperwork. If you have not heard from us regarding the results in 2 weeks, please contact this office.

## 2017-02-15 NOTE — Progress Notes (Signed)
[facility] [address], [place] [address] [phone]  Date:  02/15/2017   Name:  Alison Young   DOB:  [birth year]   MRN:  [id number]  PCP:  Carilyn GoodpastureWillard, Jennifer, PA-C    History of Present Illness:  Alison Young is a 26 y.o. female patient who presents to PCP with  Chief Complaint  Patient presents with  . Std check    no symptoms  . Urinary Tract Infection    Dysuria, patient took Azo states she feels better     --patient reports dysuria, which has improved with azo.  She reports that she has no abnormal vaginal discharge, nausea, fever, or abnormal back pain. Sexually active some unprotected.  Patient Active Problem List   Diagnosis Date Noted  . Reactive airway disease 03/26/2014  . ADHD (attention deficit hyperactivity disorder) 03/26/2014  . Rapid palpitations 04/07/2013  . Dizziness 04/07/2013  . Recurrent spontaneous pneumothorax 04/17/2011    Past Medical History:  Diagnosis Date  . Allergic rhinitis   . Asthma   . Bundle branch block, right   . Pneumothorax     Past Surgical History:  Procedure Laterality Date  . CHEST TUBE INSERTION    . PLEURADESIS Right 09/15/2013   Procedure: PLEURADESIS with TALC APPLICATION;  Surgeon: Kerin PernaPeter Van Trigt, MD;  [facility];  Service: Thoracic;  Laterality: Right;  Marland Kitchen. VIDEO ASSISTED THORACOSCOPY Right 09/15/2013   Procedure: VIDEO ASSISTED THORACOSCOPY;  Surgeon: Kerin PernaPeter Van Trigt, MD;  [facility];  Service: Thoracic;  Laterality: Right;  with stapling of blebs  . WRIST SURGERY     Left    Social History  Substance Use Topics  . Smoking status: Never Smoker  . Smokeless tobacco: Never Used  . Alcohol use Yes    Family History  Problem Relation Age of Onset  . Allergies Mother   . Asthma Mother   . Heart disease Mother        valve repair  . Allergies Father   . Allergies Sister   . Asthma Sister   . Breast cancer Maternal Grandmother   . Cervical cancer Maternal Grandmother      No Known Allergies  Medication list has been reviewed and updated.  Current Outpatient Prescriptions on File Prior to Visit  Medication Sig Dispense Refill  . drospirenone-ethinyl estradiol (YASMIN,ZARAH,SYEDA) 3-0.03 MG tablet Take 1 tablet by mouth daily. 1 Package 2  . levocetirizine (XYZAL) 5 MG tablet Take 5 mg by mouth daily.      . montelukast (SINGULAIR) 10 MG tablet Take 10 mg by mouth daily.       No current facility-administered medications on file prior to visit.     ROS ROS otherwise unremarkable unless listed above.  Physical Examination: BP 102/69   Pulse 90   Temp 98.2 F (36.8 C) (Oral)   Resp 16   Ht 5\' 7"  (1.702 m)   Wt 134 lb (60.8 kg)   LMP 02/01/2017   SpO2 98%   BMI 20.99 kg/m  Ideal Body Weight: Weight in (lb) to have BMI = 25: 159.3  Physical Exam  Constitutional: She is oriented to person, place, and time. She appears well-developed and well-nourished. No distress.  HENT:  Head: Normocephalic and atraumatic.  Right Ear: External ear normal.  Left Ear: External ear normal.  Eyes: Pupils are equal, round, and reactive to light. Conjunctivae and EOM are normal.  Cardiovascular: Normal rate.   Pulmonary/Chest: Effort normal. No respiratory distress.  Abdominal:  Soft. Normal appearance and bowel sounds are normal. There is tenderness in the suprapubic area. There is no CVA tenderness.  Neurological: She is alert and oriented to person, place, and time.  Skin: She is not diaphoretic.  Psychiatric: She has a normal mood and affect. Her behavior is normal.    Results for orders placed or performed in visit on 02/15/17  POCT urinalysis dipstick  Result Value Ref Range   Color, UA yellow yellow   Clarity, UA clear clear   Glucose, UA negative negative mg/dL   Bilirubin, UA negative negative   Ketones, POC UA trace (5) (A) negative mg/dL   Spec Grav, UA 1.6101.015 9.6041.010 - 1.025   Blood, UA trace-intact (A) negative   pH, UA 7.0 5.0 - 8.0    Protein Ur, POC negative negative mg/dL   Urobilinogen, UA 0.2 0.2 or 1.0 E.U./dL   Nitrite, UA Positive (A) Negative   Leukocytes, UA Small (1+) (A) Negative    Assessment and Plan: Alison Young is a 26 y.o. female who is here today for cc of dysuria. Dysuria - Plan: POCT urinalysis dipstick, GC/Chlamydia Probe Amp, Urine Culture, nitrofurantoin, macrocrystal-monohydrate, (MACROBID) 100 MG capsule, POCT Wet + KOH Prep  Screening examination for STD (sexually transmitted disease) - Plan: RPR, HIV antibody, GC/Chlamydia Probe Amp  Unprotected sex - Plan: POCT urine pregnancy, nitrofurantoin, macrocrystal-monohydrate, (MACROBID) 100 MG capsule, POCT Wet + KOH Prep  Vaginal pruritus - Plan: POCT Wet + KOH Prep  Trena PlattStephanie English, PA-C [facility] 8/16/201812:24 PM

## 2017-02-16 LAB — HIV ANTIBODY (ROUTINE TESTING W REFLEX): HIV Screen 4th Generation wRfx: NONREACTIVE

## 2017-02-16 LAB — RPR: RPR Ser Ql: NONREACTIVE

## 2017-02-17 ENCOUNTER — Telehealth: Payer: Self-pay | Admitting: Physician Assistant

## 2017-02-17 LAB — GC/CHLAMYDIA PROBE AMP
CHLAMYDIA, DNA PROBE: NEGATIVE
Neisseria gonorrhoeae by PCR: NEGATIVE

## 2017-02-17 LAB — URINE CULTURE

## 2017-02-17 NOTE — Telephone Encounter (Signed)
Pt states she is not feeling any better and she does not think the meds are working.  Contact number [phone]

## 2017-02-17 NOTE — Telephone Encounter (Signed)
Discussed with patient.Marland Kitchen.Marland Kitchen.The urine culture showed beta hemolytic streptococcus.  This can be treated with keflex.  Stop the macrobid at this time.

## 2017-02-17 NOTE — Telephone Encounter (Signed)
Return call placed to patient, states that in the past she has had immediate relief from UTI sx after starting medications. Patient states that she is not feeling any better since her visit. Please advise./ S.Yuliya Nova,CMA

## 2017-02-18 MED ORDER — CEPHALEXIN 500 MG PO CAPS: 500.0000 mg | ORAL_CAPSULE | Freq: Two times a day (BID) | ORAL | 0 refills | Status: AC

## 2017-02-23 ENCOUNTER — Encounter: Payer: Self-pay | Admitting: Physician Assistant

## 2017-03-01 ENCOUNTER — Other Ambulatory Visit: Payer: Self-pay | Admitting: Physician Assistant

## 2017-03-01 MED ORDER — FLUCONAZOLE 150 MG PO TABS: 150.0000 mg | ORAL_TABLET | Freq: Once | ORAL | 0 refills | Status: AC

## 2017-07-22 ENCOUNTER — Ambulatory Visit: Payer: BLUE CROSS/BLUE SHIELD | Attending: Neurosurgery | Admitting: Physical Therapy

## 2017-07-22 ENCOUNTER — Encounter: Payer: Self-pay | Admitting: Physical Therapy

## 2017-07-22 DIAGNOSIS — M6281 Muscle weakness (generalized): Secondary | ICD-10-CM | POA: Insufficient documentation

## 2017-07-22 DIAGNOSIS — M5442 Lumbago with sciatica, left side: Secondary | ICD-10-CM

## 2017-07-22 NOTE — Patient Instructions (Signed)
        Lie face down, hands close to chest. Press trunk up, arching back. Keep neck long, shoulders down. Tighten buttocks to protect lower back. Hold ___3_ seconds. Repeat __10__ times. Do __3__ sessions per day.   Abdominal draw ins:  All positions, don't hold your breath 10x hold 5 sec     Lavinia SharpsStacy Yordan [place] PT [facility] [address], [address] [place], [place] [address] Phone # [phone] Fax [phone]

## 2017-07-22 NOTE — Therapy (Signed)
[facility] [address] [place], [place], [address] Phone: [phone]   Fax:  [phone]  Physical Therapy Evaluation  Patient Details  Name: Alison Young MRN: [id number] Date of Birth: January 09, 1991 Referring Provider: Dr. Newell CoralNudelman   Encounter Date: 07/22/2017  PT End of Session - 07/22/17 1838    Visit Number  1    Number of Visits  30    Date for PT Re-Evaluation  09/16/17    Authorization Type  30 visit limit    PT Start Time  1015    PT Stop Time  1100    PT Time Calculation (min)  45 min    Activity Tolerance  Patient tolerated treatment well       Past Medical History:  Diagnosis Date  . Allergic rhinitis   . Asthma   . Bundle branch block, right   . Pneumothorax     Past Surgical History:  Procedure Laterality Date  . CHEST TUBE INSERTION    . PLEURADESIS Right 09/15/2013   Procedure: PLEURADESIS with TALC APPLICATION;  Surgeon: Kerin PernaPeter Van Trigt, MD;  [facility];  Service: Thoracic;  Laterality: Right;  Marland Kitchen. VIDEO ASSISTED THORACOSCOPY Right 09/15/2013   Procedure: VIDEO ASSISTED THORACOSCOPY;  Surgeon: Kerin PernaPeter Van Trigt, MD;  [facility];  Service: Thoracic;  Laterality: Right;  with stapling of blebs  . WRIST SURGERY     Left    There were no vitals filed for this visit.   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  Central back pain.  Worse now than when started.  Left buttock to foot initially but now buttock and back of thigh.      Pertinent History  broke tailbone last Jan    Limitations  Standing;House hold activities;Lifting    How long can you sit comfortably?  15-20 min    How long can you walk comfortably?  short walks    Diagnostic tests  Xray and MRI small lumbar bulge    Patient Stated Goals  no pain    Currently in Pain?  Yes    Pain Score  8     Pain Location  Back    Pain Orientation  Lower    Pain Onset  More than a month ago    Pain Frequency  Constant    Aggravating Factors   mornings, night, standing     Pain Relieving Factors  as the day goes on;  lying supine with ice or heat         [facility] PT Assessment - 07/22/17 0001      Assessment   Medical Diagnosis  lumbago with sciatica    Referring Provider  Dr. 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   No    Is the patient reluctant to leave  their home because of a fear of falling?   No      Home Public house manager residence    Home Access  Stairs to enter    Entrance Stairs-Number of Steps  3    Home Layout  Two level    Alternate Level Stairs-Number of Steps  10      Prior Function   Level of Independence  Independent    Vocation  Full time employment    Vocation Requirements  preschool unable to work since before [employer] and will be out until end of Jan    Leisure  haven't played soccer since broke tailbone, hang out with friends      Observation/Other Assessments   Focus on Therapeutic Outcomes (FOTO)   59% limitation      Posture/Postural Control   Posture/Postural Control  No significant limitations      AROM   AROM Assessment Site  -- no directional preference    Left Hip External Rotation   45    Left Hip Internal Rotation   10    Lumbar Flexion  70    Lumbar Extension  20 pain    Lumbar - Right Side Bend  35    Lumbar - Left Side Bend  35      Strength   Strength Assessment Site  Hip    Left Hip Extension  4/5    Left Hip ABduction  4-/5    Lumbar Flexion  4/5 decreased transvere  abdominus activation    Lumbar Extension  4/5      Flexibility   Hamstrings  decreased left    Quadriceps  decreased left hip flexor length      Palpation   Palpation comment  mild tenderness gluteals      Slump test   Findings  Negative      Prone Knee Bend Test   Findings  Positive      Straight Leg Raise   Findings  Negative             Objective measurements completed on examination: See above findings.              PT Education - 07/22/17 1837    Education provided  Yes    Education Details  trial of press ups, abdominal bracing, sit with lumbar roll, encouraged short walks    Person(s) Educated  Patient    Methods  Explanation;Demonstration;Handout       PT Short Term Goals - 07/22/17 1851      PT SHORT TERM GOAL #1   Title  The patient will demonstrate understanding of basic self care, postural correction, appropriate ex    Time  4    Period  Weeks    Status  New    Target Date  08/19/17      PT SHORT TERM GOAL #2   Title  The patient will report a 30% reduction in back and left LE pain    Time  4    Period  Weeks    Status  New      PT SHORT TERM GOAL #3   Title  The patient will have improved lumbar extension to 25 degrees and lumbar flexion to 75 degrees needed for return to [employer] duties    Time  4    Period  Weeks    Status  New        PT Long Term Goals - 07/22/17  1902      PT LONG TERM GOAL #1   Title  The patient will be independent in safe self progression of HEP    Time  8    Period  Weeks    Status  New    Target Date  09/16/17      PT LONG TERM GOAL #2   Title  The patient will report a 60% improvement in pain with usual ADLS    Time  8    Period  Weeks    Status  New      PT LONG TERM GOAL #3   Title  Left hip abduction strength and core strength grossly 4+/5 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  Her pain level is constant and severe 8/10.  She is worse in the mornings, evenings and with standing.  She is limited to sitting 15-20 min and can walk short distances only.  Her past history is significant for a tailbone fracture last Jan.  Painful lumbar flexion and extension ROM.  Decreased left hip internal rotation.  Decreased hamstring and hip flexor muscle lengths.  Weakness left hip abduction.  Positive prone knee bend test.  She would benefit from PT to address these deficits in order for her to return to work and normal daily function.      History and Personal Factors relevant to plan of care:  1 year ago tailbone fracture;  lack of other co-morbidities; good home support    Clinical Presentation  Evolving    Clinical Presentation due to:  symptoms worsening over time and radiating    Clinical Decision Making  Low    Rehab Potential  Good    Clinical Impairments Affecting Rehab Potential  none    PT Frequency  2x / week    PT Duration  8 weeks    PT Treatment/Interventions  ADLs/Self Care Home Management;Cryotherapy;Electrical Stimulation;Ultrasound;Traction;Moist Heat;Iontophoresis 4mg /ml Dexamethasone;Gait training;Therapeutic activities;Therapeutic exercise;Patient/family education;Neuromuscular re-education;Manual techniques;Taping;Dry needling    PT Next Visit Plan  assess response to prone press ups;   electrical stimulation/heat for pain control;  manual therapy;  possible DN to piriformis;  kinesiotaping    Consulted and Agree with Plan of Care  Patient       Patient will benefit from skilled therapeutic intervention in order to improve the following deficits and impairments:  Pain, Decreased activity tolerance, Decreased range of motion, Decreased strength, Difficulty walking, Increased muscle spasms, Increased fascial restricitons  Visit Diagnosis: Acute bilateral low back pain with left-sided sciatica - Plan: PT plan of care cert/re-cert  Muscle weakness (generalized) - Plan: PT plan of care cert/re-cert     Problem List Patient Active Problem List   Diagnosis Date Noted  . Reactive airway disease 03/26/2014  . ADHD (attention deficit hyperactivity disorder) 03/26/2014  . Rapid palpitations 04/07/2013  . Dizziness 04/07/2013  . Recurrent spontaneous pneumothorax 04/17/2011   Lavinia Sharps, PT 07/22/17 7:10 PM Phone: [phone] Fax: [phone]  Vivien Presto 07/22/2017, 7:10 PM  [facility] [address] [place], [place], [address] Phone: [phone]   Fax:  [phone]  Name: Fancy Dunkley MRN: [id number] Date of Birth: 1991/02/05

## 2017-07-27 ENCOUNTER — Encounter: Payer: Self-pay | Admitting: Physical Therapy

## 2017-07-27 ENCOUNTER — Ambulatory Visit: Payer: BLUE CROSS/BLUE SHIELD | Admitting: Physical Therapy

## 2017-07-27 DIAGNOSIS — M5442 Lumbago with sciatica, left side: Secondary | ICD-10-CM

## 2017-07-27 DIAGNOSIS — M6281 Muscle weakness (generalized): Secondary | ICD-10-CM

## 2017-07-27 NOTE — Therapy (Signed)
[facility] [address], [address] [place], [place], [address] Phone: [phone]   Fax:  [phone]  Physical Therapy Treatment  Patient Details  Name: Alison Young MRN: [id number] Date of Birth: [birth year] Referring Provider: Dr. Newell CoralNudelman   Encounter Date: 07/27/2017  PT End of Session - 07/27/17 1243    Visit Number  2    Number of Visits  30    Date for PT Re-Evaluation  09/16/17    Authorization Type  30 visit limit    PT Start Time  1015    PT Stop Time  1100    PT Time Calculation (min)  45 min    Activity Tolerance  Patient tolerated treatment well       Past Medical History:  Diagnosis Date  . Allergic rhinitis   . Asthma   . Bundle branch block, right   . Pneumothorax     Past Surgical History:  Procedure Laterality Date  . CHEST TUBE INSERTION    . PLEURADESIS Right 09/15/2013   Procedure: PLEURADESIS with TALC APPLICATION;  Surgeon: Kerin PernaPeter Van Trigt, MD;  [facility];  Service: Thoracic;  Laterality: Right;  Marland Kitchen. VIDEO ASSISTED THORACOSCOPY Right 09/15/2013   Procedure: VIDEO ASSISTED THORACOSCOPY;  Surgeon: Kerin PernaPeter Van Trigt, MD;  [facility];  Service: Thoracic;  Laterality: Right;  with stapling of blebs  . WRIST SURGERY     Left    There were no vitals filed for this visit.  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  Left buttock/lateral left thigh not really back now.      Currently in Pain?  Yes    Pain Score  4     Pain Location  Buttocks    Pain Orientation  Left                      [facility] Adult PT Treatment/Exercise - 07/27/17 0001      Lumbar Exercises: Stretches   Piriformis Stretch  3 reps;20 seconds    Piriformis Stretch Limitations  supine      Lumbar Exercises: Supine   Ab Set  5 reps    Bent Knee Raise  10 reps      Lumbar Exercises: Prone   Other Prone Lumbar Exercises   press ups 10x      Moist Heat Therapy   Number Minutes Moist Heat  5 Minutes    Moist Heat Location  Lumbar Spine;Hip      Manual Therapy   Soft tissue mobilization  lumbar paraspinals, piriformis, gluteals        Trigger Point Dry Needling - 07/27/17 1242    Consent Given?  Yes    Education Handout Provided  Yes    Muscles Treated Lower Body  Gluteus minimus;Gluteus maximus;Piriformis bil lumbar multifidi    Gluteus Maximus Response  Twitch response elicited;Palpable increased muscle length    Gluteus Minimus Response  Twitch response elicited;Palpable increased muscle length    Piriformis Response  Twitch response elicited;Palpable increased muscle length       Left only    PT Education - 07/27/17 1054    Education provided  Yes    Education Details  piriformis;  abd brace knee lift; dry needling info    Person(s) Educated  Patient    Methods  Explanation;Demonstration;Handout    Comprehension  Verbalized  understanding;Returned demonstration       PT Short Term Goals - 07/22/17 1851      PT SHORT TERM GOAL #1   Title  The patient will demonstrate understanding of basic self care, postural correction, appropriate ex    Time  4    Period  Weeks    Status  New    Target Date  08/19/17      PT SHORT TERM GOAL #2   Title  The patient will report a 30% reduction in back and left LE pain    Time  4    Period  Weeks    Status  New      PT SHORT TERM GOAL #3   Title  The patient will have improved lumbar extension to 25 degrees and lumbar flexion to 75 degrees needed for return to [employer] duties    Time  4    Period  Weeks    Status  New        PT Long Term Goals - 07/22/17 1902      PT LONG TERM GOAL #1   Title  The patient will be independent in safe self progression of HEP    Time  8    Period  Weeks    Status  New    Target Date  09/16/17      PT LONG TERM GOAL #2   Title  The patient will report a 60% improvement in pain with usual ADLS    Time   8    Period  Weeks    Status  New      PT LONG TERM GOAL #3   Title  Left hip abduction strength and core strength grossly 4+/5 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  She is receptive to DN and multiple tender points indentified especially in piriformis muscle.  Following DN and manual therapy, she reports she not longer feels the left buttock pain.  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      Rehab Potential  Good    Clinical Impairments Affecting Rehab Potential  none    PT Frequency  2x / week    PT Duration  8 weeks    PT Treatment/Interventions  ADLs/Self Care Home Management;Cryotherapy;Electrical Stimulation;Ultrasound;Traction;Moist Heat;Iontophoresis 4mg /ml Dexamethasone;Gait training;Therapeutic activities;Therapeutic exercise;Patient/family education;Neuromuscular re-education;Manual techniques;Taping;Dry needling    PT Next Visit Plan  assess response to prone press ups and DN #1;   electrical stimulation/heat for pain  control;  manual therapy;  core strengthening;   kinesiotaping       Patient will benefit from skilled therapeutic intervention in order to improve the following deficits and impairments:  Pain, Decreased activity tolerance, Decreased range of  motion, Decreased strength, Difficulty walking, Increased muscle spasms, Increased fascial restricitons  Visit Diagnosis: Acute bilateral low back pain with left-sided sciatica  Muscle weakness (generalized)     Problem List Patient Active Problem List   Diagnosis Date Noted  . Reactive airway disease 03/26/2014  . ADHD (attention deficit hyperactivity disorder) 03/26/2014  . Rapid palpitations 04/07/2013  . Dizziness 04/07/2013  . Recurrent spontaneous pneumothorax 04/17/2011   Lavinia Sharps, PT 07/27/17 12:52 PM Phone: [phone] Fax: [phone]  Vivien Presto 07/27/2017, 12:51 PM  [facility] [address] [place], [place], [address] Phone: [phone]   Fax:  [phone]  Name: Alison Young MRN: [id number] Date of Birth: 18-Mar-1991

## 2017-07-27 NOTE — Patient Instructions (Addendum)
Piriformis (Supine)  Cross legs, right on top. Gently pull other knee toward chest until stretch is felt in buttock/hip of top leg. Hold __20__ seconds. Repeat __2-3__ times per set. Do __1__ sets per session. Do _3___ sessions per day.  Hip Stretch  Put right ankle over left knee. Let right knee fall downward, but keep ankle in place. Feel the stretch in hip. May push down gently with hand to feel stretch. Hold __20__ seconds while counting out loud. Repeat with other leg. Repeat ___3_ times. Do __3__ sessions per day.  Stretching: Piriformis   Cross left leg over other thigh and hold 20 sec __ sets per session. Do __3__ sessions per day.  Stretching: Piriformis (Supine)  Pull right knee toward opposite shoulder. Hold ___20_ seconds. Relax. Repeat _3__ times per set. Do ___1_ sets per session. Do ____ sessions per day.  Piriformis Stretch, Kneeling Modified [place]  From hands and knees, slide one leg backward, turn bent other leg out slightly to side. Rest weight on outside of bent leg. If extended hip is elevated place a blanket or towel underneath to relax hip. With back straight, lay trunk forward over bent leg. Hold ___ seconds.  Repeat ___ times per session. Do ___ sessions per day.  Copyright  VHI. All rights reserved.       Trigger Point Dry Needling  . What is Trigger Point Dry Needling (DN)? o DN is a physical therapy technique used to treat muscle pain and dysfunction. Specifically, DN helps deactivate muscle trigger points (muscle knots).  o A thin filiform needle is used to penetrate the skin and stimulate the underlying trigger point. The goal is for a local twitch response (LTR) to occur and for the trigger point to relax. No medication of any kind is injected during the procedure.   . What Does Trigger Point Dry Needling Feel Like?  o The procedure feels different for each individual patient. Some patients report that they do not actually feel the needle enter  the skin and overall the process is not painful. Very mild bleeding may occur. However, many patients feel a deep cramping in the muscle in which the needle was inserted. This is the local twitch response.   Marland Kitchen. How Will I feel after the treatment? o Soreness is normal, and the onset of soreness may not occur for a few hours. Typically this soreness does not last longer than two days.  o Bruising is uncommon, however; ice can be used to decrease any possible bruising.  o In rare cases feeling tired or nauseous after the treatment is normal. In addition, your symptoms may get worse before they get better, this period will typically not last longer than 24 hours.   . What Can I do After My Treatment? o Increase your hydration by drinking more water for the next 24 hours. o You may place ice or heat on the areas treated that have become sore, however, do not use heat on inflamed or bruised areas. Heat often brings more relief post needling. o You can continue your regular activities, but vigorous activity is not recommended initially after the treatment for 24 hours. o DN is best combined with other physical therapy such as strengthening, stretching, and other therapies.     Lavinia SharpsStacy Ranelle Auker PT [facility] [address], [address] [place], [place] [address] Phone # [phone] Fax [phone]

## 2017-07-29 ENCOUNTER — Encounter: Payer: Self-pay | Admitting: Physical Therapy

## 2017-07-29 ENCOUNTER — Ambulatory Visit: Payer: BLUE CROSS/BLUE SHIELD | Admitting: Physical Therapy

## 2017-07-29 DIAGNOSIS — M6281 Muscle weakness (generalized): Secondary | ICD-10-CM

## 2017-07-29 DIAGNOSIS — M5442 Lumbago with sciatica, left side: Secondary | ICD-10-CM

## 2017-07-29 NOTE — Patient Instructions (Signed)
     With pressups:   Turn heels out, toes inward to keep the gluteal muscles relaxed   Add exhale at the top of press ups  Abduction: Clam (Eccentric) - Side-Lying   Lie on side with knees bent. Lift top knee, keeping feet together. Keep trunk steady. Slowly lower for 3-5 seconds. _15__ reps per set, __1_ sets per day, _5-7__ days per week. Can do with the band.    Copyright  VHI. All rights reserved.    Lavinia SharpsStacy Jamicheal Heard PT [facility] [address], [address] [place], [place] [address] Phone # [phone] Fax [phone]

## 2017-07-29 NOTE — Therapy (Signed)
[facility] [address], [address] [place], [place], [address] Phone: [phone]   Fax:  [phone]  Physical Therapy Treatment  Patient Details  Name: Alison Young MRN: [id number] Date of Birth: 1991/01/27 Referring Provider: Dr. Newell CoralNudelman   Encounter Date: 07/29/2017  PT End of Session - 07/29/17 1142    Visit Number  3    Number of Visits  30    Date for PT Re-Evaluation  09/16/17    Authorization Type  30 visit limit    PT Start Time  1105    PT Stop Time  1150    PT Time Calculation (min)  45 min    Activity Tolerance  Patient tolerated treatment well       Past Medical History:  Diagnosis Date  . Allergic rhinitis   . Asthma   . Bundle branch block, right   . Pneumothorax     Past Surgical History:  Procedure Laterality Date  . CHEST TUBE INSERTION    . PLEURADESIS Right 09/15/2013   Procedure: PLEURADESIS with TALC APPLICATION;  Surgeon: Kerin PernaPeter Van Trigt, MD;  [facility];  Service: Thoracic;  Laterality: Right;  Marland Kitchen. VIDEO ASSISTED THORACOSCOPY Right 09/15/2013   Procedure: VIDEO ASSISTED THORACOSCOPY;  Surgeon: Kerin PernaPeter Van Trigt, MD;  [facility];  Service: Thoracic;  Laterality: Right;  with stapling of blebs  . WRIST SURGERY     Left    There were no vitals filed for this visit.  Subjective Assessment - 07/29/17 1109    Subjective  I'm doing good.  I thought the DN helped.  No soreness.      Currently in Pain?  Yes    Pain Score  2     Pain Location  Back buttock    Pain Orientation  Left    Pain Type  Chronic pain    Pain Frequency  Constant                      [facility] Adult PT Treatment/Exercise - 07/29/17 0001      Lumbar Exercises: Sidelying   Clam  Right;Left;10 reps    Clam Limitations  green band      Lumbar Exercises: Prone   Other Prone Lumbar Exercises  press ups 10x with exhale 5x      Manual Therapy   Soft tissue mobilization  instrument assisted soft  tissue to bil lumbar parspinals, left gluteals    Kinesiotex  Facilitate Muscle      Kinesiotix   Facilitate Muscle   3 strips H formation             PT Education - 07/29/17 1140    Education provided  Yes    Education Details  clams blue band;  press ups with exhale    Person(s) Educated  Patient    Methods  Explanation;Handout;Demonstration    Comprehension  Verbalized understanding;Returned demonstration       PT Short Term Goals - 07/22/17 1851      PT SHORT TERM GOAL #1   Title  The patient will demonstrate understanding of basic self care, postural correction, appropriate ex    Time  4    Period  Weeks    Status  New    Target Date  08/19/17      PT SHORT TERM GOAL #2   Title  The patient will report a 30% reduction in back and left LE pain    Time  4    Period  Weeks    Status  New      PT SHORT TERM GOAL #3   Title  The patient will have improved lumbar extension to 25 degrees and lumbar flexion to 75 degrees needed for return to [employer] duties    Time  4    Period  Weeks    Status  New        PT Long Term Goals - 07/22/17 1902      PT LONG TERM GOAL #1   Title  The patient will be independent in safe self progression of HEP    Time  8    Period  Weeks    Status  New    Target Date  09/16/17      PT LONG TERM GOAL #2   Title  The patient will report a 60% improvement in pain with usual ADLS    Time  8    Period  Weeks    Status  New      PT LONG TERM GOAL #3   Title  Left hip abduction strength and core strength grossly 4+/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    She reports good pain relief with DN last visit.   Able to tolerate a progression of hip strengthening without pain exacerbation.  Good response to electrical stimulation/heat.      Rehab Potential  Good    Clinical Impairments Affecting Rehab Potential  none    PT Frequency  2x / week    PT Duration  8 weeks    PT Treatment/Interventions  ADLs/Self Care Home Management;Cryotherapy;Electrical Stimulation;Ultrasound;Traction;Moist Heat;Iontophoresis 4mg /ml Dexamethasone;Gait training;Therapeutic activities;Therapeutic exercise;Patient/family education;Neuromuscular re-education;Manual techniques;Taping;Dry needling    PT Next Visit Plan  assess response to kinesiotape and electrical stimulation/heat;   DN #2 to left gluteals, piriformis  manual therapy;  core strengthening       Patient will benefit from skilled therapeutic intervention in order to improve the following deficits and impairments:  Pain, Decreased activity tolerance, Decreased range of motion, Decreased strength, Difficulty walking, Increased muscle spasms, Increased fascial restricitons  Visit Diagnosis: Acute bilateral low back pain with left-sided sciatica  Muscle weakness (generalized)     Problem List Patient Active Problem List   Diagnosis Date Noted  . Reactive airway disease 03/26/2014  . ADHD (attention deficit hyperactivity disorder) 03/26/2014  . Rapid palpitations 04/07/2013  . Dizziness 04/07/2013  . Recurrent spontaneous pneumothorax 04/17/2011   Lavinia Sharps, PT 07/29/17 5:06 PM Phone: [phone] Fax: [phone]  Vivien Presto 07/29/2017, 5:06 PM  [facility] [address]  [address], [address] [place], [place], [address] Phone:  [phone]   Fax:  [phone]  Name: Alison Young MRN: [id number] Date of Birth: 1990-10-16

## 2017-08-03 ENCOUNTER — Ambulatory Visit: Payer: BLUE CROSS/BLUE SHIELD

## 2017-08-03 DIAGNOSIS — M6281 Muscle weakness (generalized): Secondary | ICD-10-CM

## 2017-08-03 DIAGNOSIS — M5442 Lumbago with sciatica, left side: Secondary | ICD-10-CM

## 2017-08-03 NOTE — Therapy (Signed)
[facility] [address], [address] [place], [place], [address] Phone: [phone]   Fax:  [phone]  Physical Therapy Treatment  Patient Details  Name: Alison Young MRN: [id number] Date of Birth: 10/06/1990 Referring Provider: Dr. Newell CoralNudelman   Encounter Date: 08/03/2017  PT End of Session - 08/03/17 1059    Visit Number  4    Number of Visits  30    Date for PT Re-Evaluation  09/16/17    Authorization Type  30 visit limit    PT Start Time  1011    PT Stop Time  1108    PT Time Calculation (min)  57 min    Activity Tolerance  Patient tolerated treatment well    Behavior During Therapy  [facility] for tasks assessed/performed       Past Medical History:  Diagnosis Date  . Allergic rhinitis   . Asthma   . Bundle branch block, right   . Pneumothorax     Past Surgical History:  Procedure Laterality Date  . CHEST TUBE INSERTION    . PLEURADESIS Right 09/15/2013   Procedure: PLEURADESIS with TALC APPLICATION;  Surgeon: Kerin PernaPeter Van Trigt, MD;  [facility];  Service: Thoracic;  Laterality: Right;  Marland Kitchen. VIDEO ASSISTED THORACOSCOPY Right 09/15/2013   Procedure: VIDEO ASSISTED THORACOSCOPY;  Surgeon: Kerin PernaPeter Van Trigt, MD;  [facility];  Service: Thoracic;  Laterality: Right;  with stapling of blebs  . WRIST SURGERY     Left    There were no vitals filed for this visit.  Subjective Assessment - 08/03/17 1012    Subjective  DN helped me.  I had shooting pain down my legs after dinner last night.  Not sure what caused it.     Currently in Pain?  No/denies                      [facility] Adult PT Treatment/Exercise - 08/03/17 0001      Lumbar Exercises: Stretches   Piriformis Stretch  3 reps;20 seconds    Piriformis Stretch Limitations  supine      Lumbar Exercises: Sidelying   Clam  Right;Left;10 reps    Clam Limitations  green band      Lumbar Exercises: Prone   Other Prone Lumbar Exercises  press ups 10x  with exhale 5x      Moist Heat Therapy   Number Minutes Moist Heat  12 Minutes    Moist Heat Location  Lumbar Spine      Manual Therapy   Manual Therapy  Soft tissue mobilization;Myofascial release    Manual therapy comments  soft tissue elongation and trigger point release to bil gluteals and lumbar paraspinals       Trigger Point Dry Needling - 08/03/17 1056    Consent Given?  Yes    Muscles Treated Lower Body  Gluteus minimus;Gluteus maximus;Piriformis bil gluteals and bil lumbar multifidi    Gluteus Maximus Response  Twitch response elicited;Palpable increased muscle length    Gluteus Minimus Response  Twitch response elicited;Palpable increased muscle length    Piriformis Response  Twitch response elicited;Palpable increased muscle length             PT Short Term Goals - 08/03/17 1013      PT SHORT TERM GOAL #1   Title  The patient will demonstrate understanding of basic self care, postural correction, appropriate ex    Status  Achieved      PT SHORT  TERM GOAL #2   Title  The patient will report a 30% reduction in back and left LE pain    Baseline  20% better    Time  4    Period  Weeks    Status  On-going        PT Long Term Goals - 07/22/17 1902      PT LONG TERM GOAL #1   Title  The patient will be independent in safe self progression of HEP    Time  8    Period  Weeks    Status  New    Target Date  09/16/17      PT LONG TERM GOAL #2   Title  The patient will report a 60% improvement in pain with usual ADLS    Time  8    Period  Weeks    Status  New      PT LONG TERM GOAL #3   Title  Left hip abduction strength and core strength grossly 4+/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  Pt is independent and compliant in HEP for strength and flexibility.  Pt with tension and trigger points in the gluteals and lumbar spine and demonstrated improved tissue mobility and reduced pain after dry needling and manual therapy.  Pt will continue to benefit from skilled PT for LE core strength, flexibility, manual and modalities as needed.      Rehab Potential  Good    PT Frequency  2x / week    PT Duration  8 weeks    PT Treatment/Interventions  ADLs/Self Care Home Management;Cryotherapy;Electrical Stimulation;Ultrasound;Traction;Moist Heat;Iontophoresis 4mg /ml Dexamethasone;Gait training;Therapeutic activities;Therapeutic exercise;Patient/family education;Neuromuscular re-education;Manual techniques;Taping;Dry needling    PT Next Visit Plan  assess response to DN #2 to left gluteals, piriformis  manual therapy;  core strengthening    Consulted and Agree with Plan of Care  Patient       Patient will benefit from skilled therapeutic intervention in order to improve the following deficits and impairments:  Pain, Decreased activity tolerance, Decreased range of motion, Decreased strength, Difficulty walking, Increased muscle spasms, Increased fascial restricitons  Visit Diagnosis: Acute bilateral low back pain with left-sided sciatica  Muscle weakness (generalized)     Problem List Patient Active Problem List   Diagnosis Date Noted  . Reactive airway disease 03/26/2014  . ADHD (attention deficit hyperactivity disorder) 03/26/2014  . Rapid palpitations 04/07/2013  . Dizziness 04/07/2013  . Recurrent spontaneous pneumothorax 04/17/2011   [name], PT 08/03/17 11:01  AM  [facility] [address], [address] [place], [place], [address]  Phone: [phone]   Fax:  [phone]  Name: Alison Young MRN: [id number] Date of Birth: 27-Apr-1991

## 2017-08-05 ENCOUNTER — Encounter: Payer: Self-pay | Admitting: Physical Therapy

## 2017-08-05 ENCOUNTER — Ambulatory Visit: Payer: BLUE CROSS/BLUE SHIELD | Admitting: Physical Therapy

## 2017-08-05 DIAGNOSIS — M5442 Lumbago with sciatica, left side: Secondary | ICD-10-CM

## 2017-08-05 DIAGNOSIS — M6281 Muscle weakness (generalized): Secondary | ICD-10-CM

## 2017-08-05 NOTE — Therapy (Signed)
[facility] [address], [address] [place], [place], [address] Phone: [phone]   Fax:  [phone]  Physical Therapy Treatment  Patient Details  Name: Alison Young MRN: [id number] Date of Birth: [birth year] Referring Provider: Dr. Newell CoralNudelman   Encounter Date: 08/05/2017  PT End of Session - 08/05/17 1106    Visit Number  5    Number of Visits  30    Date for PT Re-Evaluation  09/16/17    Authorization Type  30 visit limit    PT Start Time  1015    PT Stop Time  1111    PT Time Calculation (min)  56 min    Activity Tolerance  Patient tolerated treatment well    Behavior During Therapy  [facility] for tasks assessed/performed       Past Medical History:  Diagnosis Date  . Allergic rhinitis   . Asthma   . Bundle branch block, right   . Pneumothorax     Past Surgical History:  Procedure Laterality Date  . CHEST TUBE INSERTION    . PLEURADESIS Right 09/15/2013   Procedure: PLEURADESIS with TALC APPLICATION;  Surgeon: Kerin PernaPeter Van Trigt, MD;  [facility];  Service: Thoracic;  Laterality: Right;  Marland Kitchen. VIDEO ASSISTED THORACOSCOPY Right 09/15/2013   Procedure: VIDEO ASSISTED THORACOSCOPY;  Surgeon: Kerin PernaPeter Van Trigt, MD;  [facility];  Service: Thoracic;  Laterality: Right;  with stapling of blebs  . WRIST SURGERY     Left    There were no vitals filed for this visit.  Subjective Assessment - 08/05/17 1021    Subjective  Last dry needling session was very painful and I have been sore since then.     Pertinent History  broke tailbone last Jan    Limitations  Standing;House hold activities;Lifting    How long can you sit comfortably?  15-20 min    How long can you walk comfortably?  short walks    Diagnostic tests  Xray and MRI small lumbar bulge    Patient Stated Goals  no pain    Currently in Pain?  Yes    Pain Score  5     Pain Location  Back    Pain Orientation  Lower    Pain Descriptors / Indicators  Aching     Pain Type  Chronic pain    Pain Onset  More than a month ago    Pain Frequency  Constant    Aggravating Factors   mornings, night, standing    Pain Relieving Factors  as the day goes on; lying supine with ice or heat    Multiple Pain Sites  No                      [facility] Adult PT Treatment/Exercise - 08/05/17 0001      Lumbar Exercises: Stretches   Active Hamstring Stretch  2 reps;Left;Right;30 seconds supine    Quad Stretch  2 reps;Left;Right;30 seconds sidely    Piriformis Stretch  3 reps;20 seconds    Piriformis Stretch Limitations  supine      Lumbar Exercises: Supine   Ab Set  5 reps    Clam  20 reps;1 second abdominal bracing and not hold breath    Bridge  10 reps;1 second keeping hips balanced    Isometric Hip Flexion  10 reps;5 seconds work on hip control with bringing leg down      Lumbar Exercises: Sidelying   Clam  Right;Left;10 reps    Clam Limitations  green band      Modalities   Modalities  Moist Heat      Moist Heat Therapy   Number Minutes Moist Heat  10 Minutes prone    Moist Heat Location  Lumbar Spine      Manual Therapy   Manual Therapy  Soft tissue mobilization;Joint mobilization    Manual therapy comments  soft tissue elongation and trigger point release to bil gluteals and lumbar paraspinals    Joint Mobilization  P-A to L1-L5 grade 2    Soft tissue mobilization  instrument assisted soft tissue to bil lumbar parspinals, left gluteals               PT Short Term Goals - 08/03/17 1013      PT SHORT TERM GOAL #1   Title  The patient will demonstrate understanding of basic self care, postural correction, appropriate ex    Status  Achieved      PT SHORT TERM GOAL #2   Title  The patient will report a 30% reduction in back and left LE pain    Baseline  20% better    Time  4    Period  Weeks    Status  On-going        PT Long Term Goals - 07/22/17 1902      PT LONG TERM GOAL #1   Title  The patient will be independent  in safe self progression of HEP    Time  8    Period  Weeks    Status  New    Target Date  09/16/17      PT LONG TERM GOAL #2   Title  The patient will report a 60% improvement in pain with usual ADLS    Time  8    Period  Weeks    Status  New      PT LONG TERM GOAL #3   Title  Left hip abduction strength and core strength grossly 4+/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  Patient had increased tightness in bilateral gluteals and lumbar paraspinals.  No dry needling today due to patient being sore from last session.  Patient needs verbal cues to keep pelvis leveled with bridges.  Patient will benefit from skilled therapy for LE core strength, flexibility, manual and modalities as needed.    Rehab Potential  Good    Clinical Impairments Affecting Rehab Potential  none    PT Frequency  2x / week    PT Duration  8 weeks    PT Treatment/Interventions  ADLs/Self Care Home  Management;Cryotherapy;Electrical Stimulation;Ultrasound;Traction;Moist Heat;Iontophoresis 4mg /ml Dexamethasone;Gait training;Therapeutic activities;Therapeutic exercise;Patient/family education;Neuromuscular re-education;Manual techniques;Taping;Dry needling    PT Next Visit Plan  Dry needling if needed; core strength; body mechanics; soft tissue work; modalities as needed    PT Home Exercise Plan  body mechanics with daily tasks    Consulted and Agree with Plan of Care  Patient        Patient will benefit from skilled therapeutic intervention in order to improve the following deficits and impairments:  Pain, Decreased activity tolerance, Decreased range of motion, Decreased strength, Difficulty walking, Increased muscle spasms, Increased fascial restricitons  Visit Diagnosis: Acute bilateral low back pain with left-sided sciatica  Muscle weakness (generalized)     Problem List Patient Active Problem List   Diagnosis Date Noted  . Reactive airway disease 03/26/2014  . ADHD (attention deficit hyperactivity disorder) 03/26/2014  . Rapid palpitations 04/07/2013  . Dizziness 04/07/2013  . Recurrent spontaneous pneumothorax 04/17/2011    Eulis Foster, PT 08/05/17 11:10 AM   [facility] [address] [place], [place], [address] Phone: [phone]   Fax:  [phone]  Name: Alison Young MRN: [id number] Date of Birth: [birth year]

## 2017-08-10 ENCOUNTER — Ambulatory Visit: Payer: BLUE CROSS/BLUE SHIELD | Admitting: Physical Therapy

## 2017-08-10 ENCOUNTER — Encounter: Payer: Self-pay | Admitting: Physical Therapy

## 2017-08-10 DIAGNOSIS — M6281 Muscle weakness (generalized): Secondary | ICD-10-CM

## 2017-08-10 DIAGNOSIS — M5442 Lumbago with sciatica, left side: Secondary | ICD-10-CM

## 2017-08-10 NOTE — Therapy (Signed)
[facility] [address], [address] [place], [place], [address] Phone: [phone]   Fax:  [phone]  Physical Therapy Treatment  Patient Details  Name: Alison Young MRN: [id number] Date of Birth: 02/24/1991 Referring Provider: Dr. Newell Coral   Encounter Date: 08/10/2017  PT End of Session - 08/10/17 1741    Visit Number  6    Number of Visits  30    Date for PT Re-Evaluation  09/16/17    Authorization Type  30 visit limit    PT Start Time  1445    PT Stop Time  1530    PT Time Calculation (min)  45 min    Activity Tolerance  Patient tolerated treatment well       Past Medical History:  Diagnosis Date  . Allergic rhinitis   . Asthma   . Bundle branch block, right   . Pneumothorax     Past Surgical History:  Procedure Laterality Date  . CHEST TUBE INSERTION    . PLEURADESIS Right 09/15/2013   Procedure: PLEURADESIS with TALC APPLICATION;  Surgeon: [name], MD;  Location: [facility] OR;  Service: Thoracic;  Laterality: Right;  Marland Kitchen VIDEO ASSISTED THORACOSCOPY Right 09/15/2013   Procedure: VIDEO ASSISTED THORACOSCOPY;  Surgeon: [name], MD;  Location: [facility] OR;  Service: Thoracic;  Laterality: Right;  with stapling of blebs  . WRIST SURGERY     Left    There were no vitals filed for this visit.  Subjective Assessment - 08/10/17 1444    Subjective  Woke up really sore today.  Tired feeling.  Slept on her friend's couch over the weekend so that could have bothered.  Expresses frustration in her continued pain.      Pertinent History  broke tailbone last Jan    Currently in Pain?  Yes    Pain Score  6     Pain Location  Back    Pain Orientation  Lower Left > right buttock    Pain Type  Chronic pain    Aggravating Factors   mornings;     Pain Relieving Factors  usually better as day goes on;  better as day goes                      [facility] Adult PT Treatment/Exercise - 08/10/17 0001      Lumbar  Exercises: Supine   Other Supine Lumbar Exercises  discussed core strengthening and review of abdominal brace series      Lumbar Exercises: Sidelying   Clam Limitations  discussed no resistance since she reports these have been painful with continued pain > 24 hours     Moist Heat Therapy   Number Minutes Moist Heat  15 Minutes    Moist Heat Location  Lumbar Spine      Electrical Stimulation   Electrical Stimulation Location  lumbar    Electrical Stimulation Action  IFC    Electrical Stimulation Parameters  10 ma 15 min prone    Electrical Stimulation Goals  Pain      Manual Therapy   Manual therapy comments  soft tissue elongation and trigger point release to bil gluteals and lumbar paraspinals    Kinesiotex  Facilitate Muscle 3 strips abdominals       Trigger Point Dry Needling - 08/10/17 1457    Muscles Treated Lower Body  Gluteus minimus;Gluteus maximus;Piriformis bil lumbar multifidi    Gluteus Maximus Response  Twitch response elicited;Palpable increased  muscle length    Gluteus Minimus Response  Twitch response elicited;Palpable increased muscle length    Piriformis Response  Twitch response elicited;Palpable increased muscle length             PT Short Term Goals - 08/03/17 1013      PT SHORT TERM GOAL #1   Title  The patient will demonstrate understanding of basic self care, postural correction, appropriate ex    Status  Achieved      PT SHORT TERM GOAL #2   Title  The patient will report a 30% reduction in back and left LE pain    Baseline  20% better    Time  4    Period  Weeks    Status  On-going        PT Long Term Goals - 07/22/17 1902      PT LONG TERM GOAL #1   Title  The patient will be independent in safe self progression of HEP    Time  8    Period  Weeks    Status  New    Target Date  09/16/17      PT LONG TERM GOAL #2   Title  The patient will report a 60% improvement in pain with usual ADLS    Time  8    Period  Weeks    Status   New      PT LONG TERM GOAL #3   Title  Left hip abduction strength and core strength 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  Treatment focus on pain management secondary to her perception that "everything just feels irritated today."  Good initial response to small thickness needles with DN, electrical stimulation and heat and kinesiotaping.      Rehab Potential  Good    Clinical Impairments Affecting Rehab Potential  none    PT Frequency  2x / week    PT Duration  8 weeks    PT Treatment/Interventions  ADLs/Self Care Home Management;Cryotherapy;Electrical Stimulation;Ultrasound;Traction;Moist Heat;Iontophoresis 4mg /ml Dexamethasone;Gait training;Therapeutic activities;Therapeutic exercise;Patient/family education;Neuromuscular re-education;Manual techniques;Taping;Dry needling    PT Next Visit Plan  assess response to DN#3 and Kinesiotape to lower abdominals;  focus on core strengthening;  modalities as needed       Patient will benefit from skilled therapeutic intervention in order to improve the following deficits and impairments:  Pain, Decreased activity tolerance, Decreased range of motion, Decreased strength, Difficulty walking, Increased muscle spasms, Increased fascial restricitons  Visit Diagnosis: Acute bilateral low back pain with left-sided sciatica  Muscle weakness (generalized)     Problem List Patient Active  Problem List  Diagnosis Date Noted  . Reactive airway disease 03/26/2014  . ADHD (attention deficit hyperactivity disorder) 03/26/2014  . Rapid palpitations 04/07/2013  . Dizziness 04/07/2013  . Recurrent spontaneous pneumothorax 04/17/2011   Lavinia SharpsStacy Simpson, PT 08/10/17 5:48 PM Phone: [phone] Fax: [phone]  Vivien PrestoSimpson, Stacy C 08/10/2017, 5:47 PM  [facility] [address], [address] [place], [place], [address] Phone: [phone]   Fax:  [phone]  Name: Alison Young MRN: [id number] Date of Birth: 05/21/1991

## 2017-08-12 ENCOUNTER — Ambulatory Visit: Payer: BLUE CROSS/BLUE SHIELD

## 2017-08-12 DIAGNOSIS — M6281 Muscle weakness (generalized): Secondary | ICD-10-CM

## 2017-08-12 DIAGNOSIS — M5442 Lumbago with sciatica, left side: Secondary | ICD-10-CM | POA: Diagnosis not present

## 2017-08-12 NOTE — Therapy (Signed)
[facility] [address] [place], [place], [address] Phone: [phone]   Fax:  [phone]  Physical Therapy Treatment  Patient Details  Name: Alison Young MRN: [id number] Date of Birth: 09/08/1990 Referring Provider: Dr. Newell Coral   Encounter Date: 08/12/2017  PT End of Session - 08/12/17 1102    Visit Number  7    Number of Visits  30    Date for PT Re-Evaluation  09/16/17    PT Start Time  1018    PT Stop Time  1112    PT Time Calculation (min)  54 min    Activity Tolerance  Patient tolerated treatment well    Behavior During Therapy  [facility] for tasks assessed/performed       Past Medical History:  Diagnosis Date  . Allergic rhinitis   . Asthma   . Bundle branch block, right   . Pneumothorax     Past Surgical History:  Procedure Laterality Date  . CHEST TUBE INSERTION    . PLEURADESIS Right 09/15/2013   Procedure: PLEURADESIS with TALC APPLICATION;  Surgeon: [name], MD;  Location: [facility] OR;  Service: Thoracic;  Laterality: Right;  Marland Kitchen VIDEO ASSISTED THORACOSCOPY Right 09/15/2013   Procedure: VIDEO ASSISTED THORACOSCOPY;  Surgeon: [name], MD;  Location: [facility] OR;  Service: Thoracic;  Laterality: Right;  with stapling of blebs  . WRIST SURGERY     Left    There were no vitals filed for this visit.  Subjective Assessment - 08/12/17 1024    Subjective  I was a little bit sore after dry needling and not as sore today.      Currently in Pain?  Yes    Pain Score  3     Pain Orientation  Lower    Pain Descriptors / Indicators  Aching                      [facility] Adult PT Treatment/Exercise - 08/12/17 0001      Exercises   Exercises  Lumbar;Knee/Hip      Lumbar Exercises: Stretches   Active Hamstring Stretch  2 reps;Left;Right;30 seconds seated    Piriformis Stretch  3 reps;20 seconds    Piriformis Stretch Limitations  seated      Lumbar Exercises: Aerobic   UBE (Upper Arm  Bike)  seated on green ball Level 1x 6 (3/3)      Lumbar Exercises: Supine   Clam  20 reps;1 second abdominal bracing with red band    Other Supine Lumbar Exercises  supine on foam roll: horizontal abduction and D2 2x10      Knee/Hip Exercises: Seated   Sit to Sand  20 reps holding yellow ball with abdominal bracing      Moist Heat Therapy   Number Minutes Moist Heat  15 Minutes    Moist Heat Location  Lumbar Spine      Electrical Stimulation   Electrical Stimulation Location  lumbar    Electrical Stimulation Action  IFC    Electrical Stimulation Parameters  15 minutes    Electrical Stimulation Goals  Pain               PT Short Term Goals - 08/12/17 1026      PT SHORT TERM GOAL #1   Title  The patient will demonstrate understanding of basic self care, postural correction, appropriate ex    Status  Achieved      PT SHORT  TERM GOAL #2   Title  The patient will report a 30% reduction in back and left LE pain    Baseline  20% better    Time  4    Period  Weeks    Status  On-going        PT Long Term Goals - 08/12/17 1028      PT LONG TERM GOAL #4   Title  The patient will be able to walk community distances with minimal pain     Status  Achieved            Plan - 08/12/17 1042    Clinical Impression Statement  Pt reports 20% overall improvement since the start of care.  Pt was able to participate in cardio exercise and strength for core without increased pain.  Pt with general strength and endurance deficits due to injury and immobilization after injury.  Pt will benefit from skilled PT for core strength progression to allow for return to work.    Rehab Potential  Good    PT Frequency  2x / week    PT Duration  8 weeks    PT Treatment/Interventions  ADLs/Self Care Home Management;Cryotherapy;Electrical Stimulation;Ultrasound;Traction;Moist Heat;Iontophoresis 4mg /ml Dexamethasone;Gait training;Therapeutic activities;Therapeutic exercise;Patient/family  education;Neuromuscular re-education;Manual techniques;Taping;Dry needling    PT Next Visit Plan  endurance and strength;  focus on core strengthening;  modalities as needed.  Dry needling #4    Consulted and Agree with Plan of Care  Patient       Patient will benefit from skilled therapeutic intervention in order to improve the following deficits and impairments:  Pain, Decreased activity tolerance, Decreased range of motion, Decreased strength, Difficulty walking, Increased muscle spasms, Increased fascial restricitons  Visit Diagnosis: Acute bilateral low back pain with left-sided sciatica  Muscle weakness (generalized)     Problem List Patient Active Problem List   Diagnosis Date Noted  . Reactive airway disease 03/26/2014  . ADHD (attention deficit hyperactivity disorder) 03/26/2014  . Rapid palpitations 04/07/2013  . Dizziness 04/07/2013  . Recurrent spontaneous pneumothorax 04/17/2011     Lorrene ReidKelly Takacs, PT 08/12/17 11:05 AM  [facility] [address] [place], [place], [address] Phone: [phone]   Fax:  [phone]  Name: Alison Young MRN: [id number] Date of Birth: 02/17/1991

## 2017-08-17 ENCOUNTER — Ambulatory Visit: Payer: BLUE CROSS/BLUE SHIELD | Admitting: Physical Therapy

## 2017-08-17 ENCOUNTER — Encounter: Payer: Self-pay | Admitting: Physical Therapy

## 2017-08-17 DIAGNOSIS — M6281 Muscle weakness (generalized): Secondary | ICD-10-CM

## 2017-08-17 DIAGNOSIS — M5442 Lumbago with sciatica, left side: Secondary | ICD-10-CM | POA: Diagnosis not present

## 2017-08-17 NOTE — Therapy (Signed)
[facility] [address], [address] [place], [place], [address] Phone: [phone]   Fax:  [phone]  Physical Therapy Treatment  Patient Details  Name: Alison Young MRN: [id number] Date of Birth: 08/12/1990 Referring Provider: Dr. Newell Coral   Encounter Date: 08/17/2017  PT End of Session - 08/17/17 1726    Visit Number  8    Number of Visits  30    Date for PT Re-Evaluation  09/16/17    Authorization Type  30 visit limit    PT Start Time  1445    PT Stop Time  1535    PT Time Calculation (min)  50 min    Activity Tolerance  Patient tolerated treatment well       Past Medical History:  Diagnosis Date  . Allergic rhinitis   . Asthma   . Bundle branch block, right   . Pneumothorax     Past Surgical History:  Procedure Laterality Date  . CHEST TUBE INSERTION    . PLEURADESIS Right 09/15/2013   Procedure: PLEURADESIS with TALC APPLICATION;  Surgeon: [name], MD;  Location: [facility] OR;  Service: Thoracic;  Laterality: Right;  Marland Kitchen VIDEO ASSISTED THORACOSCOPY Right 09/15/2013   Procedure: VIDEO ASSISTED THORACOSCOPY;  Surgeon: [name], MD;  Location: [facility] OR;  Service: Thoracic;  Laterality: Right;  with stapling of blebs  . WRIST SURGERY     Left    There were no vitals filed for this visit.  Subjective Assessment - 08/17/17 1446    Subjective  I have a headache today.  My back is good.  I stayed in bed all weekend b/c I thought I was getting sick.  I was active yesterday and lifted a heavy box without difficulty.  I see Dr. Newell Coral today.  I'm definitely getting better.  Mornings are better.      Currently in Pain?  Yes    Pain Score  1     Pain Location  Back    Pain Orientation  Lower                      [facility] Adult PT Treatment/Exercise - 08/17/17 0001      Lumbar Exercises: Aerobic   UBE (Upper Arm Bike)  seated on green ball Level 1x 6 (3/3)      Lumbar Exercises: Supine   Ab  Set  5 reps    Pelvic Tilt Limitations  foam roll for cervical nods and rotations 5x each    Bent Knee Raise  10 reps    Straight Leg Raise  -- pilates blue band hip extension therapist holding 10x bil    Isometric Hip Flexion  10 reps;5 seconds work on hip control with bringing leg down    Other Supine Lumbar Exercises  thoracic spine extension over foam roll    Other Supine Lumbar Exercises  supine on foam red band horizontal abduction, sash, overheads narrow and wide grip       Lumbar Exercises: Prone   Other Prone Lumbar Exercises  plank on knees/forearms 5 sec hold 12x      Knee/Hip Exercises: Seated   Sit to Sand  20 reps holding yellow ball with abdominal bracing      Moist Heat Therapy   Number Minutes Moist Heat  15 Minutes    Moist Heat Location  Lumbar Spine      Electrical Stimulation   Electrical Stimulation Location  lumbar  Electrical Stimulation Action  IFC    Electrical Stimulation Parameters  15 min 12 ma prone    Electrical Stimulation Goals  Pain             PT Education - 08/17/17 1725    Education provided  Yes    Education Details  info on foam roll;  Therapeutic Pilates for LBP class info    Person(s) Educated  Patient    Methods  Explanation;Handout;Demonstration    Comprehension  Verbalized understanding;Returned demonstration       PT Short Term Goals - 08/17/17 1729      PT SHORT TERM GOAL #1   Title  The patient will demonstrate understanding of basic self care, postural correction, appropriate ex    Status  Achieved      PT SHORT TERM GOAL #2   Title  The patient will report a 30% reduction in back and left LE pain    Time  4    Period  Weeks    Status  On-going      PT SHORT TERM GOAL #3   Title  The patient will have improved lumbar extension to 25 degrees and lumbar flexion to 75 degrees needed for return to [employer]function/work duties    Time  4    Status  On-going        PT Long Term Goals - 08/12/17 1028      PT LONG TERM  GOAL #4   Title  The patient will be able to walk community distances with minimal pain     Status  Achieved            Plan - 08/17/17 1726    Clinical Impression Statement  The patient reports her back is feeling better overall.   She is receptive to a progression of exercise particularly for core strengthening and states, "I can tell I've gotten weak."  She is very interested in getting a foam roll for home use.  Able to participate in a moderate intensity program with minimal discomfort.      Rehab Potential  Good    Clinical Impairments Affecting Rehab Potential  none    PT Frequency  2x / week    PT Duration  8 weeks    PT Next Visit Plan  see how appt with Dr. Newell CoralNudelman went;  recheck lumbar ROM for STG next visit;  check % improvement for STG;  endurance and strength;  focus on core strengthening;  modalities as needed.  Dry needling #4 if needed       Patient will benefit from skilled therapeutic intervention in order to improve the following deficits and impairments:  Pain, Decreased activity tolerance, Decreased range of motion, Decreased strength, Difficulty walking, Increased muscle spasms, Increased fascial restricitons  Visit Diagnosis: Acute bilateral low back pain with left-sided sciatica  Muscle weakness (generalized)     Problem List Patient Active Problem List   Diagnosis Date Noted  . Reactive airway disease 03/26/2014  . ADHD (attention deficit hyperactivity disorder) 03/26/2014  . Rapid palpitations 04/07/2013  . Dizziness 04/07/2013  . Recurrent spontaneous pneumothorax 04/17/2011   Lavinia SharpsStacy Kelcie Currie, PT 08/17/17 5:31 PM Phone: [phone] Fax: [phone]  Vivien PrestoSimpson, Mariha Sleeper C 08/17/2017, 5:31 PM  [facility] [address], [address] [place], [place], [address] Phone: [phone]   Fax:  [phone]  Name: Alison Young MRN: [id number] Date of Birth: 11-May-1991

## 2017-08-24 ENCOUNTER — Encounter: Payer: BLUE CROSS/BLUE SHIELD | Admitting: Physical Therapy

## 2017-08-24 ENCOUNTER — Encounter: Payer: Self-pay | Admitting: Physical Therapy

## 2017-08-24 ENCOUNTER — Ambulatory Visit: Payer: BLUE CROSS/BLUE SHIELD | Attending: Neurosurgery | Admitting: Physical Therapy

## 2017-08-24 DIAGNOSIS — M6281 Muscle weakness (generalized): Secondary | ICD-10-CM | POA: Insufficient documentation

## 2017-08-24 DIAGNOSIS — M5442 Lumbago with sciatica, left side: Secondary | ICD-10-CM | POA: Diagnosis present

## 2017-08-24 NOTE — Therapy (Signed)
[facility] [address] [place], [place], [address] Phone: [phone]   Fax:  [phone]  Physical Therapy Treatment  Patient Details  Name: Alison Young MRN: [id number] Date of Birth: [birth year] Referring Provider: Dr. Newell Coral   Encounter Date: 08/24/2017  PT End of Session - 08/24/17 1410    Visit Number  9    Number of Visits  30    Date for PT Re-Evaluation  09/16/17    Authorization Type  30 visit limit    PT Start Time  1403    PT Stop Time  1458    PT Time Calculation (min)  55 min    Activity Tolerance  Patient tolerated treatment well;No increased pain    Behavior During Therapy  W[facility] for tasks assessed/performed       Past Medical History:  Diagnosis Date  . Allergic rhinitis   . Asthma   . Bundle branch block, right   . Pneumothorax     Past Surgical History:  Procedure Laterality Date  . CHEST TUBE INSERTION    . PLEURADESIS Right 09/15/2013   Procedure: PLEURADESIS with TALC APPLICATION;  Surgeon: [name], MD;  Location: [facility] OR;  Service: Thoracic;  Laterality: Right;  Marland Kitchen VIDEO ASSISTED THORACOSCOPY Right 09/15/2013   Procedure: VIDEO ASSISTED THORACOSCOPY;  Surgeon: [name], MD;  Location: [facility] OR;  Service: Thoracic;  Laterality: Right;  with stapling of blebs  . WRIST SURGERY     Left    There were no vitals filed for this visit.  Subjective Assessment - 08/24/17 1404    Subjective  Pt reports that things are going ok. She saw Dr. Newell Coral last week and he said things look good and he will only follow up if needed. She returned to work today and has some pretty bad back pain upon arrival.     Currently in Pain?  Yes    Pain Score  8     Pain Location  Back    Pain Orientation  Mid;Lower    Pain Descriptors / Indicators  Aching;Burning    Pain Type  Chronic pain    Pain Radiating Towards  none     Pain Onset  More than a month ago    Pain Frequency  Constant     Aggravating Factors   alot of lifting and being up on her feet     Pain Relieving Factors  stretches help some                       [facility] Adult PT Treatment/Exercise - 08/24/17 0001      Lumbar Exercises: Aerobic   Elliptical  elliptical L1 x2 min forward/backward PT present to discuss progress      Lumbar Exercises: Supine   Dead Bug  10 reps    Isometric Hip Flexion  10 reps;5 seconds abdominal bracing to decrease pelvic movement    Other Supine Lumbar Exercises   B knees to chest on green physioball x20 reps with pt over pressure stretch end range    Other Supine Lumbar Exercises  supine over foam roll: marching x20 reps each LE, horizontal abd (green) x15      Lumbar Exercises: Sidelying   Other Sidelying Lumbar Exercises  thoracic rotation x15 reps Lt/Rt       Moist Heat Therapy   Number Minutes Moist Heat  15 Minutes    Moist Heat Location  Lumbar Spine  Stage managerlectrical Stimulation   Electrical Stimulation Location  lumbar    Electrical Stimulation Action  IFC    Electrical Stimulation Parameters  15 min, 16 ma    Electrical Stimulation Goals  Pain             PT Education - 08/24/17 1425    Education provided  Yes    Education Details  expecting increase in fatigue initially with return to work    Starwood HotelsPerson(s) Educated  Patient    Methods  Explanation    Comprehension  Verbalized understanding       PT Short Term Goals - 08/24/17 1412      PT SHORT TERM GOAL #1   Title  The patient will demonstrate understanding of basic self care, postural correction, appropriate ex    Status  Achieved      PT SHORT TERM GOAL #2   Title  The patient will report a 30% reduction in back and left LE pain    Baseline  50% improved     Time  4    Period  Weeks    Status  Achieved      PT SHORT TERM GOAL #3   Title  The patient will have improved lumbar extension to 25 degrees and lumbar flexion to 75 degrees needed for return to [employer]function/work duties    Time   4    Status  On-going        PT Long Term Goals - 08/12/17 1028      PT LONG TERM GOAL #4   Title  The patient will be able to walk community distances with minimal pain     Status  Achieved            Plan - 08/24/17 1411    Clinical Impression Statement  Pt arrived today with increased pain in the middle of her low back, having returned to work today. Session focused on continued progressions of trunk and hip strengthening with intermittent mobility work to decrease muscle tension and stiffness following work. Overall, pt feels that things are about 50% improved, disregarding the minor setback she had following work today. Pt was able to complete all of today's exercises without increase in her pain and reported less than 5/10 pain following exercises.    Rehab Potential  Good    Clinical Impairments Affecting Rehab Potential  none    PT Frequency  2x / week    PT Duration  8 weeks    PT Next Visit Plan  continue to promote trunk/LE strength; extension based therex; modalities as needed.  Dry needling #4 if needed       Patient will benefit from skilled therapeutic intervention in order to improve the following deficits and impairments:  Pain, Decreased activity tolerance, Decreased range of motion, Decreased strength, Difficulty walking, Increased muscle spasms, Increased fascial restricitons  Visit Diagnosis: Acute bilateral low back pain with left-sided sciatica  Muscle weakness (generalized)     Problem List Patient Active Problem List   Diagnosis Date Noted  . Reactive airway disease 03/26/2014  . ADHD (attention deficit hyperactivity disorder) 03/26/2014  . Rapid palpitations 04/07/2013  . Dizziness 04/07/2013  . Recurrent spontaneous pneumothorax 04/17/2011    Donita BrooksSara Sandro Burgo 08/24/2017, 3:14 PM  [facility] [address] [place], [place], [address] Phone: [phone]   Fax:   [phone]  Name: Alison Young MRN: [id number] Date of Birth: 1990/09/24

## 2017-08-31 ENCOUNTER — Ambulatory Visit: Payer: BLUE CROSS/BLUE SHIELD | Admitting: Physical Therapy

## 2017-08-31 ENCOUNTER — Encounter: Payer: Self-pay | Admitting: Physical Therapy

## 2017-08-31 DIAGNOSIS — M6281 Muscle weakness (generalized): Secondary | ICD-10-CM

## 2017-08-31 DIAGNOSIS — M5442 Lumbago with sciatica, left side: Secondary | ICD-10-CM

## 2017-08-31 NOTE — Therapy (Signed)
[facility] [address], [address] [place], [place], [address] Phone: [phone]   Fax:  [phone]  Physical Therapy Treatment  Patient Details  Name: Alison Young MRN: [id number] Date of Birth: 20-Feb-1991 Referring Provider: Dr. Newell CoralNudelman   Encounter Date: 08/31/2017  PT End of Session - 08/31/17 1702    Visit Number  10    Number of Visits  30    Date for PT Re-Evaluation  09/16/17    Authorization Type  30 visit limit    PT Start Time  1402    PT Stop Time  1450    PT Time Calculation (min)  48 min    Activity Tolerance  Patient tolerated treatment well       Past Medical History:  Diagnosis Date  . Allergic rhinitis   . Asthma   . Bundle branch block, right   . Pneumothorax     Past Surgical History:  Procedure Laterality Date  . CHEST TUBE INSERTION    . PLEURADESIS Right 09/15/2013   Procedure: PLEURADESIS with TALC APPLICATION;  Surgeon: Kerin PernaPeter Van Trigt, MD;  [facility];  Service: Thoracic;  Laterality: Right;  Marland Kitchen. VIDEO ASSISTED THORACOSCOPY Right 09/15/2013   Procedure: VIDEO ASSISTED THORACOSCOPY;  Surgeon: Kerin PernaPeter Van Trigt, MD;  [facility];  Service: Thoracic;  Laterality: Right;  with stapling of blebs  . WRIST SURGERY     Left    There were no vitals filed for this visit.  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  Yesterday no pain.  After last visit, TENS/heat no pain.  Had an episode last week where right leg had pain/numbness for 5 min after sitting.  Also reports she sat all day for jury duty yesterday.     Currently in Pain?  Yes    Pain Score  7     Pain Location  Back    Pain Type  Chronic pain    Pain Relieving Factors  TENs, heat                      [facility] Adult PT Treatment/Exercise - 08/31/17 0001       Self-Care   Self-Care  ADL's    ADL's  discussed hip hinge position for work       Lumbar Exercises: Aerobic   UBE (Upper Arm Bike)  seated on green ball Level 1x 6 (4/4)      Lumbar Exercises: Standing   Other Standing Lumbar Exercises  green band around thighs crouched side step 2 laps and monster walks 2 laps      Lumbar Exercises: Supine   Other Supine Lumbar Exercises  Pilates  table top with green band UE/LE movements    Other Supine Lumbar Exercises  Pilates green band whole leg push down 10x right/left      Lumbar Exercises: Quadruped   Other Quadruped Lumbar Exercises  green band hip extension 10x right/left      Moist Heat Therapy   Number Minutes Moist Heat  15 Minutes    Moist Heat Location  Lumbar Spine      Electrical Stimulation   Electrical Stimulation Location  lumbar    Electrical Stimulation Action  IFC    Electrical Stimulation Parameters  15  min 12 ma    Electrical Stimulation Goals  Pain               PT Short Term Goals - 08/31/17 2104      PT SHORT TERM GOAL #1   Title  The patient will demonstrate understanding of basic self care, postural correction, appropriate ex    Status  Achieved      PT SHORT TERM GOAL #2   Title  The patient will report a 30% reduction in back and left LE pain    Status  Achieved      PT SHORT TERM GOAL #3   Title  The patient will have improved lumbar extension to 25 degrees and lumbar flexion to 75 degrees needed for return to function/work duties    Time  4    Period  Weeks    Status  On-going        PT Long Term Goals - 08/12/17 1028      PT LONG TERM GOAL #4   Title  The patient will be able to walk community distances with minimal pain     Status  Achieved            Plan - 08/31/17 1937    Clinical Impression Statement  The patient reports an increased amount of pain following work today.  She has had sharper pain/symptoms in her leg recently following excessive sitting.  Weakness noted  with hip abductors and extensors.  Verbal and tactile cues to avoid pelvic drop in quadruped.  Good pain relief with ES/heat.      Rehab Potential  Good    Clinical Impairments Affecting Rehab Potential  none    PT Frequency  2x / week    PT Treatment/Interventions  ADLs/Self Care Home Management;Cryotherapy;Electrical Stimulation;Ultrasound;Traction;Moist Heat;Iontophoresis 4mg /ml Dexamethasone;Gait training;Therapeutic activities;Therapeutic exercise;Patient/family education;Neuromuscular re-education;Manual techniques;Taping;Dry needling    PT Next Visit Plan  recheck lumbar ROM for STG next visit;  continue to promote trunk/LE strength; extension based therex; modalities as needed.  Dry needling #4 if needed;  ES/heat;  kinesiotape for support while working       Patient will benefit from skilled therapeutic intervention in order to improve the following deficits and impairments:  Pain, Decreased activity tolerance, Decreased range of motion, Decreased strength, Difficulty walking, Increased muscle spasms, Increased fascial restricitons  Visit Diagnosis: Acute bilateral low back pain with left-sided sciatica  Muscle weakness (generalized)     Problem List Patient Active Problem List   Diagnosis Date Noted  . Reactive airway disease 03/26/2014  . ADHD (attention deficit hyperactivity disorder) 03/26/2014  . Rapid palpitations 04/07/2013  . Dizziness 04/07/2013  . Recurrent spontaneous pneumothorax 04/17/2011   Lavinia Sharps, PT 08/31/17 9:05 PM Phone: [phone] Fax: [phone]  Vivien Presto 08/31/2017, 9:05 PM  [facility] [address], [address] [place], [place], [address] Phone: [phone]   Fax:  [phone]  Name: Alison Young MRN: [id number] Date of Birth: 1991-03-10

## 2017-09-02 ENCOUNTER — Ambulatory Visit: Payer: BLUE CROSS/BLUE SHIELD

## 2017-09-02 DIAGNOSIS — M5442 Lumbago with sciatica, left side: Secondary | ICD-10-CM | POA: Diagnosis not present

## 2017-09-02 DIAGNOSIS — M6281 Muscle weakness (generalized): Secondary | ICD-10-CM

## 2017-09-02 NOTE — Therapy (Signed)
[facility] [address] [place], [place], [address] Phone: [phone]   Fax:  [phone]  Physical Therapy Treatment  Patient Details  Name: Alison Young MRN: [id number] Date of Birth: [birth year] Referring Provider: Dr. Newell Coral   Encounter Date: 09/02/2017  PT End of Session - 09/02/17 1141    Visit Number  11    Date for PT Re-Evaluation  09/16/17    Authorization Type  30 visit limit    Authorization - Visit Number  11    Authorization - Number of Visits  30    PT Start Time  1103    PT Stop Time  1201    PT Time Calculation (min)  58 min    Activity Tolerance  Patient tolerated treatment well    Behavior During Therapy  [facility] for tasks assessed/performed       Past Medical History:  Diagnosis Date  . Allergic rhinitis   . Asthma   . Bundle branch block, right   . Pneumothorax     Past Surgical History:  Procedure Laterality Date  . CHEST TUBE INSERTION    . PLEURADESIS Right 09/15/2013   Procedure: PLEURADESIS with TALC APPLICATION;  Surgeon: Alison Perna, MD;  Location: [facility] OR;  Service: Thoracic;  Laterality: Right;  Marland Kitchen VIDEO ASSISTED THORACOSCOPY Right 09/15/2013   Procedure: VIDEO ASSISTED THORACOSCOPY;  Surgeon: Alison Perna, MD;  Location: [facility] OR;  Service: Thoracic;  Laterality: Right;  with stapling of blebs  . WRIST SURGERY     Left    There were no vitals filed for this visit.  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  I am more painful after work but it seems to be soreness.      Currently in Pain?  Yes    Pain Score  3     Pain Location  Back    Pain Orientation  Lower;Mid    Pain Descriptors / Indicators  Dull;Sore    Pain Type  Chronic pain    Pain Onset  More than a month ago    Pain Frequency  Constant    Aggravating Factors   working, activity    Pain Relieving Factors  rest, stretching, heat                       [facility] Adult PT Treatment/Exercise - 09/02/17 0001      Lumbar Exercises: Aerobic   Elliptical  Level 3 x 7 minutes PT present to discuss lumbar precautions    UBE (Upper Arm Bike)  seated on green ball Level 1x 6 (4/4)      Lumbar Exercises: Standing   Row  Strengthening;Power tower;Both;Limitations;20 reps    Row Limitations  squat, abdominal bracing 25# 2x10    Shoulder Extension  Strengthening;Power Tower;Both;20 reps;Limitations    Shoulder Extension Limitations  abdominal bracing 25# 2x10    Other Standing Lumbar Exercises  green band around thighs crouched side step 2 laps and monster walks 2 laps      Lumbar Exercises: Supine   Dead Bug  10 reps    Other Supine Lumbar Exercises  supine abdominal bracing: 90 hip bil and legs down      Modalities   Modalities  Cryotherapy      Cryotherapy   Number Minutes Cryotherapy  15 Minutes    Cryotherapy Location  Lumbar Spine  Type of Cryotherapy  Ice pack      Electrical Stimulation   Electrical Stimulation Location  lumbar    Electrical Stimulation Action  IFC    Electrical Stimulation Parameters  15 minutes    Electrical Stimulation Goals  Pain               PT Short Term Goals - 08/31/17 2104      PT SHORT TERM GOAL #1   Title  The patient will demonstrate understanding of basic self care, postural correction, appropriate ex    Status  Achieved      PT SHORT TERM GOAL #2   Title  The patient will report a 30% reduction in back and left LE pain    Status  Achieved      PT SHORT TERM GOAL #3   Title  The patient will have improved lumbar extension to 25 degrees and lumbar flexion to 75 degrees needed for return to function/work duties    Time  4    Period  Weeks    Status  On-going        PT Long Term Goals - 09/02/17 1110      PT LONG TERM GOAL #1   Title  The patient will be independent in safe self progression of HEP    Time  8    Period  Weeks    Status  On-going       PT LONG TERM GOAL #2   Title  The patient will report a 60% improvement in pain with usual ADLS    Baseline  60% reported    Status  Achieved      PT LONG TERM GOAL #4   Title  The patient will be able to walk community distances with minimal pain     Status  Achieved            Plan - 09/02/17 1127    Clinical Impression Statement  Pt reports 60% overall improvement in symptoms since the start of care.  FOTO score is improved to 29% limitation (59% limitation at evaluation).  Pt continues to have weak core and demonstrates weakness with supine core strength exercises.  Pt is working on buidling her endurance at the gym and increasing hours at work.  Pt will continue to benefit from skilled PT for strength, core and flexibility.      Rehab Potential  Good    PT Frequency  2x / week    PT Duration  8 weeks    PT Treatment/Interventions  ADLs/Self Care Home Management;Cryotherapy;Electrical Stimulation;Ultrasound;Traction;Moist Heat;Iontophoresis 4mg /ml Dexamethasone;Gait training;Therapeutic activities;Therapeutic exercise;Patient/family education;Neuromuscular re-education;Manual techniques;Taping;Dry needling    PT Next Visit Plan  recheck lumbar ROM for STG next visit;  continue to promote trunk/LE strength; extension based therex; modalities as needed.    Recommended Other Services  initial certification is signed.      Consulted and Agree with Plan of Care  Patient       Patient will benefit from skilled therapeutic intervention in order to improve the following deficits and impairments:  Pain, Decreased activity tolerance, Decreased range of motion, Decreased strength, Difficulty walking, Increased muscle spasms, Increased fascial restricitons  Visit Diagnosis: Acute bilateral low back pain with left-sided sciatica  Muscle weakness (generalized)     Problem List Patient Active Problem List   Diagnosis Date Noted  . Reactive airway disease 03/26/2014  . ADHD  (attention deficit hyperactivity disorder) 03/26/2014  . Rapid palpitations 04/07/2013  . Dizziness  04/07/2013  . Recurrent spontaneous pneumothorax 04/17/2011     Alison ReidKelly Mahreen Schewe, PT 09/02/17 11:46 AM  [facility] [address], [address] [place], [place], [address] Phone: [phone]   Fax:  [phone]  Name: Joya Sanlizabeth Schmierer MRN: [id number] Date of Birth: 04/22/1991

## 2017-09-07 ENCOUNTER — Ambulatory Visit: Payer: BLUE CROSS/BLUE SHIELD | Admitting: Physical Therapy

## 2017-09-07 ENCOUNTER — Encounter: Payer: Self-pay | Admitting: Physical Therapy

## 2017-09-07 DIAGNOSIS — M5442 Lumbago with sciatica, left side: Secondary | ICD-10-CM

## 2017-09-07 DIAGNOSIS — M6281 Muscle weakness (generalized): Secondary | ICD-10-CM

## 2017-09-07 NOTE — Therapy (Signed)
[facility] [address], [address] [place], [place], [address] Phone: [phone]   Fax:  [phone]  Physical Therapy Treatment  Patient Details  Name: Alison Young MRN: [id number] Date of Birth: 10/19/1990 Referring Provider: Dr. Newell Coral   Encounter Date: 09/07/2017  PT End of Session - 09/07/17 1444    Visit Number  12    Number of Visits  30    Date for PT Re-Evaluation  09/16/17    Authorization Type  30 visit limit    Authorization - Visit Number  12    Authorization - Number of Visits  30    PT Start Time  1400    PT Stop Time  1443    PT Time Calculation (min)  43 min    Activity Tolerance  Patient tolerated treatment well       Past Medical History:  Diagnosis Date  . Allergic rhinitis   . Asthma   . Bundle branch block, right   . Pneumothorax     Past Surgical History:  Procedure Laterality Date  . CHEST TUBE INSERTION    . PLEURADESIS Right 09/15/2013   Procedure: PLEURADESIS with TALC APPLICATION;  Surgeon: [name], MD;  Location: [facility] OR;  Service: Thoracic;  Laterality: Right;  Marland Kitchen VIDEO ASSISTED THORACOSCOPY Right 09/15/2013   Procedure: VIDEO ASSISTED THORACOSCOPY;  Surgeon: [name], MD;  Location: [facility] OR;  Service: Thoracic;  Laterality: Right;  with stapling of blebs  . WRIST SURGERY     Left    There were no vitals filed for this visit.  Subjective Assessment - 09/07/17 1359    Subjective  Just got off work so I'm hurting.  On the days I don't work I'm pretty good.  Mostly a dull soreness throughout the week.  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   Really painful right now.     Currently in Pain?  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      Lumbar Exercises: Aerobic   UBE (Upper Arm Bike)  seated on green ball Level 1x 6 (4/4)      Lumbar Exercises: Supine   Clam  10 reps green band    Bridge with [employer]  10 reps    Isometric Hip Flexion  10 reps;5 seconds abdominal bracing to decrease pelvic movement    Other Supine Lumbar Exercises  Pilates green band whole leg push down 10x right/left      Lumbar Exercises: Prone   Other Prone Lumbar Exercises  head lift, arm lift I, arm lift W 8x each    Other Prone Lumbar Exercises  multifidi press 10x, press with HS curls, hip extension and bent knee hip extension 5x each      Moist Heat Therapy   Number  Minutes Moist Heat  25 Minutes    Moist Heat Location  Lumbar Spine      Electrical Stimulation   Electrical Stimulation Location  lumbar    Electrical Stimulation Action  IFC    Electrical Stimulation Parameters  25 part of the time concurrent with ex    Electrical Stimulation Goals  Pain      Kinesiotix   Facilitate Muscle   star pattern                PT Short Term Goals - 08/31/17 2104      PT SHORT TERM GOAL #1   Title  The patient will demonstrate understanding of basic self care, postural correction, appropriate ex    Status  Achieved      PT SHORT TERM GOAL #2   Title  The patient will report a 30% reduction in back and left LE pain    Status  Achieved      PT SHORT TERM GOAL #3   Title  The patient will have improved lumbar extension to 25 degrees and lumbar flexion to 75 degrees needed for return to [employer] duties    Time  4    Period  Weeks    Status  On-going        PT Long Term Goals - 09/02/17 1110      PT LONG TERM GOAL #1   Title  The patient will be independent in safe self  progression of HEP    Time  8    Period  Weeks    Status  On-going      PT LONG TERM GOAL #2   Title  The patient will report a 60% improvement in pain with usual ADLS    Baseline  60% reported    Status  Achieved      PT LONG TERM GOAL #4   Title  The patient will be able to walk community distances with minimal pain     Status  Achieved            Plan - 09/07/17 1924    Clinical Impression Statement  The patient presents with 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  She is able to tolerate supine and prone core stabilization exercises concurrently with ES/heat for pain management.  Good response to kinesiotaping.  She reports much lower pain level by end of treatment session.  Therapist closely monitoring response with all interventions.      Rehab Potential  Good    Clinical Impairments Affecting Rehab Potential  none    PT Frequency  2x / week    PT Duration  8 weeks    PT Treatment/Interventions  ADLs/Self Care Home Management;Cryotherapy;Electrical Stimulation;Ultrasound;Traction;Moist Heat;Iontophoresis 4mg /ml Dexamethasone;Gait training;Therapeutic activities;Therapeutic exercise;Patient/family education;Neuromuscular re-education;Manual techniques;Taping;Dry needling    PT Next Visit Plan  recheck lumbar ROM for STG next visit;  continue to promote trunk/LE strength;  modalities as needed; re tape if needed for support for Friday work.         Patient will benefit from skilled therapeutic intervention in order to improve the following deficits and impairments:  Pain, Decreased activity tolerance, Decreased range of motion, Decreased strength, Difficulty walking, Increased muscle spasms, Increased fascial restricitons  Visit Diagnosis: Acute bilateral low back pain with left-sided sciatica  Muscle weakness (generalized)     Problem List Patient Active Problem List   Diagnosis Date Noted  . Reactive airway disease 03/26/2014  .  ADHD (  attention deficit hyperactivity disorder) 03/26/2014  . Rapid palpitations 04/07/2013  . Dizziness 04/07/2013  . Recurrent spontaneous pneumothorax 04/17/2011    Lavinia SharpsStacy Azura Tufaro, PT 09/07/17 7:37 PM Phone: [phone] Fax: [phone]  Vivien PrestoSimpson, Natsuko Kelsay C 09/07/2017, 7:36 PM   [facility] [address], [address] [place], [place], [address] Phone: [phone]   Fax:  [phone]  Name: Alison Young MRN: [id number] Date of Birth: 04-26-1991

## 2017-09-14 ENCOUNTER — Encounter: Payer: BLUE CROSS/BLUE SHIELD | Admitting: Physical Therapy

## 2017-09-16 ENCOUNTER — Encounter: Payer: BLUE CROSS/BLUE SHIELD | Admitting: Physical Therapy

## 2017-09-28 ENCOUNTER — Encounter: Payer: Self-pay | Admitting: Physical Therapy

## 2017-09-28 ENCOUNTER — Ambulatory Visit: Payer: BLUE CROSS/BLUE SHIELD | Attending: Neurosurgery | Admitting: Physical Therapy

## 2017-09-28 DIAGNOSIS — M5442 Lumbago with sciatica, left side: Secondary | ICD-10-CM | POA: Diagnosis present

## 2017-09-28 DIAGNOSIS — M6281 Muscle weakness (generalized): Secondary | ICD-10-CM | POA: Insufficient documentation

## 2017-09-28 NOTE — Patient Instructions (Signed)
Janai Maudlin PT [facility] [address], [address] [place], [place] [address] Phone # [phone] Fax [phone]    

## 2017-09-28 NOTE — Therapy (Signed)
[facility] [address], [address] [place], [place], [address] Phone: [phone]   Fax:  [phone]  Physical Therapy Treatment/Recertification   Patient Details  Name: Alison Young MRN: [id number] Date of Birth: 14-May-1991 Referring Provider: Dr. Newell CoralNudelman   Encounter Date: 09/28/2017  PT End of Session - 09/28/17 1521    Visit Number  13    Number of Visits  30    Date for PT Re-Evaluation  11/23/17    Authorization Type  30 visit limit    PT Start Time  1445    PT Stop Time  1528    PT Time Calculation (min)  43 min    Activity Tolerance  Patient tolerated treatment well       Past Medical History:  Diagnosis Date  . Allergic rhinitis   . Asthma   . Bundle branch block, right   . Pneumothorax     Past Surgical History:  Procedure Laterality Date  . CHEST TUBE INSERTION    . PLEURADESIS Right 09/15/2013   Procedure: PLEURADESIS with TALC APPLICATION;  Surgeon: Kerin PernaPeter Van Trigt, MD;  [facility];  Service: Thoracic;  Laterality: Right;  Marland Kitchen. VIDEO ASSISTED THORACOSCOPY Right 09/15/2013   Procedure: VIDEO ASSISTED THORACOSCOPY;  Surgeon: Kerin PernaPeter Van Trigt, MD;  [facility];  Service: Thoracic;  Laterality: Right;  with stapling of blebs  . WRIST SURGERY     Left    There were no vitals filed for this visit.  Subjective Assessment - 09/28/17 1445    Subjective  Getting over flu and the stomach virus.  My back has gotten better.  I did a lot of lifting b/c my co-worker has an injury.  I was able to manage it better.  My whole back is sore rather than one spot.  The star pattern kinesiotaping was AMAZING!    Currently in Pain?  Yes    Pain Score  2     Pain Location  Back    Pain Orientation  Lower;Mid    Pain Type  Chronic pain         [facility] PT Assessment - 09/28/17 0001      Observation/Other Assessments   Focus on Therapeutic Outcomes (FOTO)   25% limitation       AROM   Left Hip External  Rotation   50    Left Hip Internal Rotation   35    Lumbar Flexion  80    Lumbar Extension  25    Lumbar - Right Side Bend  47    Lumbar - Left Side Bend  47      Strength   Left Hip Extension  4/5    Left Hip ABduction  4/5    Lumbar Flexion  4/5 decreased transvere abdominus activation    Lumbar Extension  4/5                  [facility] Adult PT Treatment/Exercise - 09/28/17 0001      Lumbar Exercises: Standing   Other Standing Lumbar Exercises  discussed return to running program with 30-60 second intervals    Other Standing Lumbar Exercises  25# resisted walking 7x forward/backward, 3x right and left side stepping      Knee/Hip Exercises: Standing   Other Standing Knee Exercises  red band hip 3 ways 10x each bil    Other Standing Knee Exercises  red band around thighs side stepping and monster walks 3 laps each  PT Education - 09/28/17 1520    Education provided  Yes    Education Details  standing hip 4 ways;  band side steps and monster walks red band    Person(s) Educated  Patient    Methods  Explanation;Demonstration;Handout    Comprehension  Returned demonstration;Verbalized understanding       PT Short Term Goals - 09/28/17 2033      PT SHORT TERM GOAL #1   Title  The patient will demonstrate understanding of basic self care, postural correction, appropriate ex    Status  Achieved      PT SHORT TERM GOAL #2   Title  The patient will report a 30% reduction in back and left LE pain    Status  Achieved      PT SHORT TERM GOAL #3   Title  The patient will have improved lumbar extension to 25 degrees and lumbar flexion to 75 degrees needed for return to function/work duties    Status  Achieved        PT Long Term Goals - 09/28/17 2033      PT LONG TERM GOAL #1   Title  The patient will be independent in safe self progression of HEP    Time  8    Period  Weeks    Status  On-going    Target Date  11/23/17      PT LONG TERM GOAL #2    Title  The patient will report a 80% improvement in pain with work duties including bending, stooping    Time  8    Period  Weeks    Status  Revised      PT LONG TERM GOAL #3   Title  Left hip abduction strength and core strength grossly 5-/5 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  She reports her back is feeling much better and she is excited b/c she just came from work, did a lot of lifting and stooping and her pain level is low in intensity.  Her lumbar ROM is much improved and function outcome score has improved from 59% limitation to 25%.  She expresses interest in returing to running and playing kickball.  She will need further lumbo/pelvic/hip core strengthening to be successful in these higher level activities.   She also expresses fearfulness in returning to these activities since it "has been a long time."   Recommend continued PT at a reduced frequency of 1x/week for for 4-6 more visits then discharge to independent HEP and gym program.      Rehab Potential  Good    Clinical Impairments Affecting Rehab Potential  none    PT Frequency  1x / week    PT Duration  8 weeks    PT Treatment/Interventions  ADLs/Self Care Home Management;Cryotherapy;Electrical Stimulation;Ultrasound;Traction;Moist Heat;Iontophoresis 4mg /ml Dexamethasone;Gait training;Therapeutic activities;Therapeutic exercise;Patient/family education;Neuromuscular  re-education;Manual techniques;Taping;Dry needling    PT Next Visit Plan  star kinesiotaping;  intermediate /higher level lumbo/pelvic/hip core strengthening;  floor sliders; 1/2 kneeling; SLS with plyoball toss;  see how interval run/walk went;  1x/week        Patient will benefit from skilled therapeutic intervention in order to improve the following deficits and impairments:  Pain, Decreased activity tolerance, Decreased range of motion, Decreased strength, Difficulty walking, Increased muscle spasms, Increased fascial restricitons  Visit Diagnosis: Acute bilateral low back pain with left-sided sciatica - Plan: PT plan of care cert/re-cert  Muscle weakness (generalized) - Plan: PT plan of care cert/re-cert     Problem List Patient Active Problem List   Diagnosis Date Noted  . Reactive airway disease 03/26/2014  . ADHD (attention deficit hyperactivity disorder) 03/26/2014  . Rapid palpitations 04/07/2013  . Dizziness 04/07/2013  . Recurrent spontaneous pneumothorax 04/17/2011   Lavinia Sharps, PT 09/28/17 8:40 PM Phone: [phone] Fax: [phone]  Vivien Presto 09/28/2017, 8:39 PM  [facility] [address] [place], [place], [address] Phone: [phone]   Fax:  [phone]  Name: Alison Young MRN: [id number] Date of Birth: [birth year]

## 2017-10-07 ENCOUNTER — Encounter: Payer: Self-pay | Admitting: Physical Therapy

## 2017-10-07 ENCOUNTER — Ambulatory Visit: Payer: BLUE CROSS/BLUE SHIELD | Admitting: Physical Therapy

## 2017-10-07 DIAGNOSIS — M5442 Lumbago with sciatica, left side: Secondary | ICD-10-CM | POA: Diagnosis not present

## 2017-10-07 DIAGNOSIS — M6281 Muscle weakness (generalized): Secondary | ICD-10-CM

## 2017-10-07 NOTE — Therapy (Signed)
[facility] [address], [address] [place], [place], [address] Phone: [phone]   Fax:  [phone]  Physical Therapy Treatment  Patient Details  Name: Alison Young MRN: [id number] Date of Birth: [birth year] Referring Provider: Dr. Newell CoralNudelman   Encounter Date: 10/07/2017  PT End of Session - 10/07/17 1531    Visit Number  14    Number of Visits  30    Date for PT Re-Evaluation  11/23/17    Authorization Type  30 visit limit    Authorization - Number of Visits  30    PT Start Time  1445    PT Stop Time  1529    PT Time Calculation (min)  44 min    Activity Tolerance  Patient tolerated treatment well       Past Medical History:  Diagnosis Date  . Allergic rhinitis   . Asthma   . Bundle branch block, right   . Pneumothorax     Past Surgical History:  Procedure Laterality Date  . CHEST TUBE INSERTION    . PLEURADESIS Right 09/15/2013   Procedure: PLEURADESIS with TALC APPLICATION;  Surgeon: Kerin PernaPeter Van Trigt, MD;  [facility];  Service: Thoracic;  Laterality: Right;  Marland Kitchen. VIDEO ASSISTED THORACOSCOPY Right 09/15/2013   Procedure: VIDEO ASSISTED THORACOSCOPY;  Surgeon: Kerin PernaPeter Van Trigt, MD;  [facility];  Service: Thoracic;  Laterality: Right;  with stapling of blebs  . WRIST SURGERY     Left    There were no vitals filed for this visit.  Subjective Assessment - 10/07/17 1443    Subjective  On a Z-pack for a sinus infection.    Back has been good.   I would like to do the kinesiotaping, I bought some for home.  Last Thursday I did some running/walking, sore the next day in muscles.      Currently in Pain?  No/denies    Pain Score  0-No pain    Pain Location  Back                      [facility] Adult PT Treatment/Exercise - 10/07/17 0001      Lumbar Exercises: Aerobic   UBE (Upper Arm Bike)  seated on green ball Level 1x 6 (4/4)      Lumbar Exercises: Standing   Other Standing Lumbar Exercises   SLS with 2# plyoball toss on rebounder each leg 20x    Other Standing Lumbar Exercises  25# resisted walking 7x forward/backward      Lumbar Exercises: Quadruped   Other Quadruped Lumbar Exercises  1/2 kneel with 2# chops, rotations, sidebending and green band horizontal abduction green band 5-10 x each  right/left      Knee/Hip Exercises: Standing   Other Standing Knee Exercises  floor sliders abduction, extension, arcs around the world       K[employer]   Facilitate Muscle   star pattern                PT Short Term Goals - 09/28/17 2033      PT SHORT TERM GOAL #1   Title  The patient will demonstrate understanding of basic self care, postural correction, appropriate ex    Status  Achieved      PT SHORT TERM GOAL #2   Title  The patient will report a 30% reduction in back and left LE pain    Status  Achieved      PT SHORT  TERM GOAL #3   Title  The patient will have improved lumbar extension to 25 degrees and lumbar flexion to 75 degrees needed for return to [employer] duties    Status  Achieved        PT Long Term Goals - 10/07/17 1535      PT LONG TERM GOAL #1   Title  The patient will be independent in safe self progression of HEP    Time  8    Period  Weeks    Status  On-going      PT LONG TERM GOAL #2   Title  The patient will report a 80% improvement in pain with work duties including bending, stooping    Time  8    Period  Weeks    Status  On-going      PT LONG TERM GOAL #3   Title  Left hip abduction strength and core strength grossly 5-/5 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  Good response to kinesio lumbar taping.  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  She will continue a interval walk /run  program.  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      Rehab Potential  Good    Clinical Impairments Affecting Rehab Potential  none    PT Frequency  1x / week    PT Duration  8 weeks    PT Treatment/Interventions  ADLs/Self Care Home Management;Cryotherapy;Electrical Stimulation;Ultrasound;Traction;Moist Heat;Iontophoresis 4mg /ml Dexamethasone;Gait training;Therapeutic activities;Therapeutic exercise;Patient/family education;Neuromuscular re-education;Manual techniques;Taping;Dry needling    PT Next Visit Plan  star kinesiotaping;  intermediate /higher level lumbo/pelvic/hip core strengthening;  floor sliders; 1/2 kneeling; SLS with plyoball toss;  see how interval run/walk went;  1x/week        Patient will benefit from skilled therapeutic intervention in order to improve the following deficits and impairments:  Pain, Decreased activity tolerance, Decreased range of motion, Decreased strength, Difficulty walking, Increased muscle spasms, Increased fascial restricitons  Visit Diagnosis: Acute bilateral low back pain with left-sided sciatica  Muscle weakness (generalized)     Problem List Patient Active Problem List   Diagnosis Date Noted  . Reactive airway disease 03/26/2014  . ADHD (attention deficit hyperactivity disorder) 03/26/2014  . Rapid palpitations 04/07/2013  . Dizziness 04/07/2013  . Recurrent spontaneous pneumothorax 04/17/2011   Lavinia Sharps, PT 10/07/17 3:37 PM Phone: [phone] Fax: [phone]  Vivien Presto 10/07/2017, 3:36 PM  [facility] [address], [address] [place], [place], [address] Phone:  [phone]   Fax:  [phone]  Name: Alison Young MRN: [id number] Date of Birth: 11/25/1990

## 2017-10-12 ENCOUNTER — Encounter: Payer: Self-pay | Admitting: Physical Therapy

## 2017-10-12 ENCOUNTER — Ambulatory Visit: Payer: BLUE CROSS/BLUE SHIELD | Admitting: Physical Therapy

## 2017-10-12 DIAGNOSIS — M6281 Muscle weakness (generalized): Secondary | ICD-10-CM

## 2017-10-12 DIAGNOSIS — M5442 Lumbago with sciatica, left side: Secondary | ICD-10-CM

## 2017-10-12 NOTE — Therapy (Signed)
[facility] [address] [place], [place], [address] Phone: [phone]   Fax:  [phone]  Physical Therapy Treatment  Patient Details  Name: Alison Young MRN: [id number] Date of Birth: June 07, 1991 Referring Provider: Dr. Newell Coral   Encounter Date: 10/12/2017  PT End of Session - 10/12/17 1607    Visit Number  15    Number of Visits  30    Date for PT Re-Evaluation  11/23/17    Authorization Type  30 visit limit    Authorization - Number of Visits  30    PT Start Time  1530    PT Stop Time  1610    PT Time Calculation (min)  40 min    Activity Tolerance  Patient tolerated treatment well       Past Medical History:  Diagnosis Date  . Allergic rhinitis   . Asthma   . Bundle branch block, right   . Pneumothorax     Past Surgical History:  Procedure Laterality Date  . CHEST TUBE INSERTION    . PLEURADESIS Right 09/15/2013   Procedure: PLEURADESIS with TALC APPLICATION;  Surgeon: [name], MD;  Location: [facility] OR;  Service: Thoracic;  Laterality: Right;  Marland Kitchen VIDEO ASSISTED THORACOSCOPY Right 09/15/2013   Procedure: VIDEO ASSISTED THORACOSCOPY;  Surgeon: [name], MD;  Location: [facility] OR;  Service: Thoracic;  Laterality: Right;  with stapling of blebs  . WRIST SURGERY     Left    There were no vitals filed for this visit.  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  I did pretty well but I'm just exhausted.  Still recovering form being sick.      Currently in Pain?  Yes    Pain Score  1     Pain Location  Back    Pain Descriptors / Indicators  Sore    Pain Type  Chronic pain                No data recorded       [facility] Adult PT Treatment/Exercise - 10/12/17 0001      Lumbar Exercises: Standing   Other Standing Lumbar Exercises  Pallof series SLS 5x each right/left       Lumbar Exercises: Supine   Other Supine  Lumbar Exercises  green band Pilates hip extension and circles 5x each right/left       Lumbar Exercises: Sidelying   Other Sidelying Lumbar Exercises  side plank 3x 10 sec hold right/left       Lumbar Exercises: Prone   Straight Leg Raise  -- knees/elbows planks 10 sec 5x    Opposite Arm/Leg Raise  Right arm/Left leg;Left arm/Right leg;5 reps    Other Prone Lumbar Exercises  over green ball hip extension and abduction bil 10x each    Other Prone Lumbar Exercises  ball walk outs 3x      Kinesiotix   Facilitate Muscle   star pattern  horizontal strip on 1st               PT Short Term Goals - 09/28/17 2033      PT SHORT TERM GOAL #1   Title  The patient will demonstrate understanding of basic self care, postural correction, appropriate ex    Status  Achieved      PT SHORT TERM GOAL #2   Title  The patient will  report a 30% reduction in back and left LE pain    Status  Achieved      PT SHORT TERM GOAL #3   Title  The patient will have improved lumbar extension to 25 degrees and lumbar flexion to 75 degrees needed for return to [employer]/work duties    Status  Achieved        PT Long Term Goals - 10/07/17 1535      PT LONG TERM GOAL #1   Title  The patient will be independent in safe self progression of HEP    Time  8    Period  Weeks    Status  On-going      PT LONG TERM GOAL #2   Title  The patient will report a 80% improvement in pain with work duties including bending, stooping    Time  8    Period  Weeks    Status  On-going      PT LONG TERM GOAL #3   Title  Left hip abduction strength and core strength grossly 5-/5 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  She is able to participate in an intermediate to more advanced level of core stabilization  without exacerbation of back pain.  She plans to restart her interval running/walking program this week.  Good progress with rehab goals.      Rehab Potential  Good    Clinical Impairments Affecting Rehab Potential  none    PT Frequency  1x / week    PT Duration  8 weeks    PT Next Visit Plan  star kinesiotaping;  intermediate /higher level lumbo/pelvic/hip core strengthening;  see how interval run/walk went;  1x/week;  check goals next visit        Patient will benefit from skilled therapeutic intervention in order to improve the following deficits and impairments:  Pain, Decreased activity tolerance, Decreased range of motion, Decreased strength, Difficulty walking, Increased muscle spasms, Increased fascial restricitons  Visit Diagnosis: Acute bilateral low back pain with left-sided sciatica  Muscle weakness (generalized)     Problem List Patient Active Problem List   Diagnosis Date Noted  . Reactive airway disease 03/26/2014  . ADHD (attention deficit hyperactivity disorder) 03/26/2014  . Rapid palpitations 04/07/2013  . Dizziness 04/07/2013  . Recurrent spontaneous pneumothorax 04/17/2011   Lavinia SharpsStacy Sadae Arrazola, PT 10/12/17 5:09 PM Phone: [phone] Fax: [phone]  Vivien PrestoSimpson, Dao Memmott C 10/12/2017, 5:09 PM  [facility] [address], [address] [place], [place], [address] Phone: [phone]   Fax:  [phone]  Name: Alison Young MRN: [id number] Date of Birth: [birth year]

## 2017-10-21 ENCOUNTER — Encounter: Payer: Self-pay | Admitting: Physical Therapy

## 2017-10-21 ENCOUNTER — Ambulatory Visit: Payer: BLUE CROSS/BLUE SHIELD | Attending: Neurosurgery | Admitting: Physical Therapy

## 2017-10-21 DIAGNOSIS — M5442 Lumbago with sciatica, left side: Secondary | ICD-10-CM

## 2017-10-21 DIAGNOSIS — M6281 Muscle weakness (generalized): Secondary | ICD-10-CM | POA: Insufficient documentation

## 2017-10-21 NOTE — Therapy (Signed)
[facility] [address], [address] [place], [place], [address] Phone: [phone]   Fax:  [phone]  Physical Therapy Treatment  Patient Details  Name: Alison Young MRN: [id number] Date of Birth: 03-01-1991 Referring Provider: Dr. Newell Coral   Encounter Date: 10/21/2017  PT End of Session - 10/21/17 1059    Visit Number  16    Number of Visits  30    Date for PT Re-Evaluation  11/23/17    Authorization Type  30 visit limit    PT Start Time  1014    PT Stop Time  1100    PT Time Calculation (min)  46 min    Activity Tolerance  Patient tolerated treatment well       Past Medical History:  Diagnosis Date  . Allergic rhinitis   . Asthma   . Bundle branch block, right   . Pneumothorax     Past Surgical History:  Procedure Laterality Date  . CHEST TUBE INSERTION    . PLEURADESIS Right 09/15/2013   Procedure: PLEURADESIS with TALC APPLICATION;  Surgeon: [name], MD;  Location: [facility] OR;  Service: Thoracic;  Laterality: Right;  Marland Kitchen VIDEO ASSISTED THORACOSCOPY Right 09/15/2013   Procedure: VIDEO ASSISTED THORACOSCOPY;  Surgeon: [name], MD;  Location: [facility] OR;  Service: Thoracic;  Laterality: Right;  with stapling of blebs  . WRIST SURGERY     Left    There were no vitals filed for this visit.  Subjective Assessment - 10/21/17 1013    Subjective  Feeling good.  I've been doing a lot so I'm a little sore but different from the usual pain.  Yesterday I got in 18,000 steps.  I'm just tired today.  Can I go to the golf driving range?      Currently in Pain?  Yes    Pain Score  2     Pain Location  Back    Pain Descriptors / Indicators  Sore    Pain Type  Chronic pain    Pain Frequency  Constant         [facility] PT Assessment - 10/21/17 0001      Strength   Left Hip Extension  4+/5    Left Hip ABduction  4+/5    Lumbar Flexion  4+/5    Lumbar Extension  4+/5                   [facility] Adult PT  Treatment/Exercise - 10/21/17 0001      Lumbar Exercises: Stretches   Standing Extension  -- thoracic extension with foam roll 6x    Other Lumbar Stretch Exercise  foam roll childs pose with bias    Other Lumbar Stretch Exercise  thread the needle with foam roll      Lumbar Exercises: Aerobic   UBE (Upper Arm Bike)  seated on green ball Level 1x 8 min (4/4)      Lumbar Exercises: Standing   Other Standing Lumbar Exercises  golf simulation with green band and 10# pulleys at low, mid and high level 15x each double and single leg      Lumbar Exercises: Quadruped   Other Quadruped Lumbar Exercises  1/2 kneel and tall kneel glute activation with hip rocking 10x each/ right /left      Kinesiotix   Facilitate Muscle   star pattern  horizontal strip on 1st  PT Short Term Goals - 10/21/17 1440      PT SHORT TERM GOAL #1   Title  The patient will demonstrate understanding of basic self care, postural correction, appropriate ex    Status  Achieved      PT SHORT TERM GOAL #2   Title  The patient will report a 30% reduction in back and left LE pain    Status  Achieved      PT SHORT TERM GOAL #3   Title  The patient will have improved lumbar extension to 25 degrees and lumbar flexion to 75 degrees needed for return to [employer]/work duties    Status  Achieved        PT Long Term Goals - 10/21/17 1440      PT LONG TERM GOAL #1   Title  The patient will be independent in safe self progression of HEP    Time  8    Period  Weeks    Status  On-going      PT LONG TERM GOAL #2   Title  The patient will report a 80% improvement in pain with work duties including bending, stooping    Time  8    Period  Weeks    Status  On-going      PT LONG TERM GOAL #3   Title  Left hip abduction strength and core strength grossly 5-/5 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  No complaint of pain with golf simulation exercises except she does fatigue quickly in upper body.  Verbal cues for hip hinge with golf swing.  Good response with stretching with foam roll.  She is progressing well and will follow up in 2 weeks as she continues with her HEP.      Rehab Potential  Good    Clinical Impairments Affecting Rehab Potential  none    PT Frequency  1x / week    PT Duration  8 weeks    PT Next Visit Plan  star kinesiotaping;  intermediate /higher level lumbo/pelvic/hip core strengthening;  see how driving range went;  follow up in 2 weeks       Patient will benefit from skilled therapeutic intervention in order to improve the following deficits and impairments:  Pain, Decreased activity tolerance, Decreased range of motion, Decreased strength, Difficulty walking, Increased muscle spasms, Increased fascial restricitons  Visit Diagnosis: Acute bilateral low back pain with left-sided sciatica  Muscle weakness (generalized)     Problem List Patient Active Problem List   Diagnosis Date Noted  . Reactive airway disease 03/26/2014  . ADHD (attention deficit hyperactivity disorder) 03/26/2014  . Rapid palpitations 04/07/2013  . Dizziness 04/07/2013  . Recurrent spontaneous  pneumothorax 04/17/2011   Lavinia Sharps, PT 10/21/17 2:43 PM Phone: [phone] Fax: [phone]  Vivien Presto 10/21/2017, 2:42 PM  [facility] [address] [place], [place], [address] Phone: [phone]   Fax:  [phone]  Name: Courtny Bennison MRN: [id number] Date of Birth: 1991/07/03

## 2017-10-27 ENCOUNTER — Encounter: Payer: Self-pay | Admitting: Physician Assistant

## 2017-11-02 ENCOUNTER — Ambulatory Visit: Payer: BLUE CROSS/BLUE SHIELD | Admitting: Physical Therapy

## 2017-11-02 ENCOUNTER — Encounter: Payer: Self-pay | Admitting: Physical Therapy

## 2017-11-02 DIAGNOSIS — M5442 Lumbago with sciatica, left side: Secondary | ICD-10-CM | POA: Diagnosis not present

## 2017-11-02 DIAGNOSIS — M6281 Muscle weakness (generalized): Secondary | ICD-10-CM

## 2017-11-02 NOTE — Therapy (Signed)
[facility] [address] [place], [place], [address] Phone: [phone]   Fax:  [phone]  Physical Therapy Treatment  Patient Details  Name: Alison Young MRN: [id number] Date of Birth: 09/15/1990 Referring Provider: Dr. Newell CoralNudelman   Encounter Date: 11/02/2017  PT End of Session - 11/02/17 1108    Visit Number  17    Number of Visits  30    Date for PT Re-Evaluation  11/23/17    Authorization Type  30 visit limit    Authorization - Number of Visits  30    PT Start Time  1103    PT Stop Time  1150    PT Time Calculation (min)  47 min    Activity Tolerance  Patient tolerated treatment well       Past Medical History:  Diagnosis Date  . Allergic rhinitis   . Asthma   . Bundle branch block, right   . Pneumothorax     Past Surgical History:  Procedure Laterality Date  . CHEST TUBE INSERTION    . PLEURADESIS Right 09/15/2013   Procedure: PLEURADESIS with TALC APPLICATION;  Surgeon: Kerin PernaPeter Van Trigt, MD;  [facility];  Service: Thoracic;  Laterality: Right;  Marland Kitchen. VIDEO ASSISTED THORACOSCOPY Right 09/15/2013   Procedure: VIDEO ASSISTED THORACOSCOPY;  Surgeon: Kerin PernaPeter Van Trigt, MD;  [facility];  Service: Thoracic;  Laterality: Right;  with stapling of blebs  . WRIST SURGERY     Left    There were no vitals filed for this visit.  Subjective Assessment - 11/02/17 1104    Subjective  I was really sore after last session followed by going to the driving range.  Yesterday I had a lot of lot of lifting and a full day yesterday.   I will have full days now until the end of July.  Today my neck and back and "all over hurts."      Pertinent History  broke tailbone last Jan    Currently in Pain?  Yes    Pain Score  4     Pain Location  Back    Pain Type  Chronic pain                       [facility] Adult PT Treatment/Exercise - 11/02/17 0001      Lumbar Exercises: Stretches   Piriformis Stretch   Right;Left;5 reps    Piriformis Stretch Limitations  with green ball      Lumbar Exercises: Aerobic   UBE (Upper Arm Bike)  seated on green ball Level 1x 8 min (4/4)      Lumbar Exercises: Supine   Isometric Hip Flexion  10 reps;5 seconds abdominal bracing to decrease pelvic movement, from ball    Other Supine Lumbar Exercises  green band Pilates hip extension and circles 5x each right/left       Lumbar Exercises: Quadruped   Other Quadruped Lumbar Exercises   tall kneel glute activation with hip rocking 10x each/ right /left      Moist Heat Therapy   Number Minutes Moist Heat  15 Minutes    Moist Heat Location  Lumbar Spine      Electrical Stimulation   Electrical Stimulation Location  lumbar    Electrical Stimulation Action  IFC    Electrical Stimulation Parameters  prone 9 ma 15 min    Electrical Stimulation Goals  Pain  PT Short Term Goals - 10/21/17 1440      PT SHORT TERM GOAL #1   Title  The patient will demonstrate understanding of basic self care, postural correction, appropriate ex    Status  Achieved      PT SHORT TERM GOAL #2   Title  The patient will report a 30% reduction in back and left LE pain    Status  Achieved      PT SHORT TERM GOAL #3   Title  The patient will have improved lumbar extension to 25 degrees and lumbar flexion to 75 degrees needed for return to function/work duties    Status  Achieved        PT Long Term Goals - 10/21/17 1440      PT LONG TERM GOAL #1   Title  The patient will be independent in safe self progression of HEP    Time  8    Period  Weeks    Status  On-going      PT LONG TERM GOAL #2   Title  The patient will report a 80% improvement in pain with work duties including bending, stooping    Time  8    Period  Weeks    Status  On-going      PT LONG TERM GOAL #3   Title  Left hip abduction strength and core strength grossly 5-/5 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 and return to running  and recreational activities    Time  8    Period  Weeks      PT LONG TERM GOAL #4   Title  The patient will be able to run/walk with intervals 1 mile     Time  8    Period  Weeks    Status  On-going      PT LONG TERM GOAL #5   Title  FOTO functional outcome score improved from 59% limitation to 32% limitation indicating improved function with less pain    Status  Achieved            Plan - 11/02/17 1920    Clinical Impression Statement  The patient reports a recent exacerbation of symptoms after a combination of a progression of exercise last visit, going to 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  Decreased intensity accordingly and used ES/heat for pain management.  Therapist  closely monitoring response to all interventions.      Rehab Potential  Good    Clinical Impairments Affecting Rehab Potential  none    PT Frequency  1x / week    PT Duration  8 weeks    PT Treatment/Interventions  ADLs/Self Care Home Management;Cryotherapy;Electrical Stimulation;Ultrasound;Traction;Moist Heat;Iontophoresis 4mg /ml Dexamethasone;Gait training;Therapeutic activities;Therapeutic exercise;Patient/family education;Neuromuscular re-education;Manual techniques;Taping;Dry needling    PT Next Visit Plan  star kinesiotaping;  intermediate /higher level lumbo/pelvic/hip core strengthening;  follow up in 2 weeks       Patient will benefit from skilled therapeutic intervention in order to improve the following deficits and impairments:  Pain, Decreased activity tolerance, Decreased range of motion, Decreased strength, Difficulty walking, Increased muscle spasms, Increased fascial restricitons  Visit Diagnosis: Acute bilateral low back pain with left-sided sciatica  Muscle weakness (generalized)     Problem List Patient Active Problem List   Diagnosis Date Noted  . Reactive airway disease 03/26/2014  . ADHD (attention deficit hyperactivity disorder) 03/26/2014  . Rapid palpitations  04/07/2013  . Dizziness 04/07/2013  . Recurrent spontaneous pneumothorax 04/17/2011  Lavinia Sharps, PT 11/02/17 7:26 PM Phone: [phone] Fax: [phone]  Vivien Presto 11/02/2017, 7:25 PM  [facility] [address], [address] [place], [place], [address] Phone: [phone]   Fax:  [phone]  Name: Alison Young MRN: [id number] Date of Birth: 09/14/1990

## 2017-11-18 ENCOUNTER — Encounter: Payer: Self-pay | Admitting: Physical Therapy

## 2017-11-18 ENCOUNTER — Ambulatory Visit: Payer: BLUE CROSS/BLUE SHIELD | Attending: Neurosurgery | Admitting: Physical Therapy

## 2017-11-18 DIAGNOSIS — M6281 Muscle weakness (generalized): Secondary | ICD-10-CM | POA: Diagnosis present

## 2017-11-18 DIAGNOSIS — M5442 Lumbago with sciatica, left side: Secondary | ICD-10-CM | POA: Insufficient documentation

## 2017-11-18 NOTE — Therapy (Signed)
[facility] [address] [place], [place], [address] Phone: [phone]   Fax:  [phone]  Physical Therapy Treatment  Patient Details  Name: Alison Young MRN: [id number] Date of Birth: [birth year] Referring Provider: Dr. Newell Coral   Encounter Date: 11/18/2017  PT End of Session - 11/18/17 1553    Visit Number  18    Number of Visits  30    Date for PT Re-Evaluation  11/23/17    Authorization Type  30 visit limit    Authorization - Number of Visits  30    PT Start Time  1445    PT Stop Time  1528    PT Time Calculation (min)  43 min    Activity Tolerance  Patient tolerated treatment well       Past Medical History:  Diagnosis Date  . Allergic rhinitis   . Asthma   . Bundle branch block, right   . Pneumothorax     Past Surgical History:  Procedure Laterality Date  . CHEST TUBE INSERTION    . PLEURADESIS Right 09/15/2013   Procedure: PLEURADESIS with TALC APPLICATION;  Surgeon: [name], MD;  Location: [facility] OR;  Service: Thoracic;  Laterality: Right;  Marland Kitchen VIDEO ASSISTED THORACOSCOPY Right 09/15/2013   Procedure: VIDEO ASSISTED THORACOSCOPY;  Surgeon: [name], MD;  [facility]: [facility] OR;  Service: Thoracic;  Laterality: Right;  with stapling of blebs  . WRIST SURGERY     Left    There were no vitals filed for this visit.  Subjective Assessment - 11/18/17 1444    Subjective  Back has been good.  Much better than last time.      Currently in Pain?  No/denies    Pain Score  0-No pain    Pain Location  Back    Pain Orientation  Lower    Pain Type  Chronic pain    Aggravating Factors   lifting infant carrier                       [facility] Adult PT Treatment/Exercise - 11/18/17 0001      Self-Care   ADL's  discussed body mechanics with infant carrier      Lumbar Exercises: Aerobic   UBE (Upper Arm Bike)  seated on green ball Level 1x 8 min (4/4)      Lumbar Exercises: Standing    Other Standing Lumbar Exercises  wall slides with green ball 10x 10 sec holds      Lumbar Exercises: Sidelying   Other Sidelying Lumbar Exercises  side plank 3x 10 sec hold right/left       Lumbar Exercises: Prone   Other Prone Lumbar Exercises  planks elbows/knees 5x 15 sec hold     Other Prone Lumbar Exercises  multifidi press 10x, press with HS curls, hip extension and bent knee hip extension 5x each      Lumbar Exercises: Quadruped   Other Quadruped Lumbar Exercises  1/2 kneel red band UE movements     Other Quadruped Lumbar Exercises  tall kneel red band horizontal 15x               PT Short Term Goals - 10/21/17 1440      PT SHORT TERM GOAL #1   Title  The patient will demonstrate understanding of basic self care, postural correction, appropriate ex    Status  Achieved      PT SHORT TERM GOAL #2  Title  The patient will report a 30% reduction in back and left LE pain    Status  Achieved      PT SHORT TERM GOAL #3   Title  The patient will have improved lumbar extension to 25 degrees and lumbar flexion to 75 degrees needed for return to [employer] duties    Status  Achieved        PT Long Term Goals - 10/21/17 1440      PT LONG TERM GOAL #1   Title  The patient will be independent in safe self progression of HEP    Time  8    Period  Weeks    Status  On-going      PT LONG TERM GOAL #2   Title  The patient will report a 80% improvement in pain with work duties including bending, stooping    Time  8    Period  Weeks    Status  On-going      PT LONG TERM GOAL #3   Title  Left hip abduction strength and core strength grossly 5-/5 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  No modalities needed today.  Improving stability noted although 1/2 kneeling is challenging.  Quick muscle fatigue in quads with wall squats.  Patient has had several exacerbations of pain in the course of her recovery; therefore, recommend she follow up in 2-3 weeks.  If she is still doing well at that time, will discharge her from PT to an independent HEP.  Therapist closely monitoring response with all and providing cues for postural alignment.      Rehab Potential  Good    Clinical Impairments Affecting Rehab Potential  none    PT Frequency  1x / week    PT Duration  8 weeks    PT Treatment/Interventions  ADLs/Self Care Home Management;Cryotherapy;Electrical Stimulation;Ultrasound;Traction;Moist Heat;Iontophoresis /ml Dexamethasone;Gait training;Therapeutic activities;Therapeutic exercise;Patient/family education;Neuromuscular re-education;Manual techniques;Taping;Dry needling    PT Next Visit Plan  star kinesiotaping and modalities as needed;  intermediate /higher level lumbo/pelvic/hip core strengthening;  follow up in 2-3 weeks       Patient will benefit from skilled therapeutic intervention in order to improve the following deficits and impairments:  Pain, Decreased activity tolerance, Decreased range of motion, Decreased strength, Difficulty walking, Increased muscle spasms, Increased fascial restricitons  Visit Diagnosis: Acute bilateral low back pain with left-sided sciatica  Muscle weakness (generalized)     Problem List Patient Active Problem List   Diagnosis Date Noted  . Reactive airway disease 03/26/2014  . ADHD (attention deficit hyperactivity disorder)  03/26/2014  . Rapid palpitations 04/07/2013  . Dizziness 04/07/2013  . Recurrent spontaneous pneumothorax 04/17/2011  Lavinia Sharps, PT 11/18/17 4:11 PM Phone: [phone] Fax: [phone]  Vivien Presto 11/18/2017, 4:11 PM  [facility] [address], [address] [place], [place], [address] Phone: [phone]   Fax:  [phone]  Name: Alison Young MRN: [id number] Date of Birth: June 11, 1991

## 2017-12-24 ENCOUNTER — Encounter: Payer: Self-pay | Admitting: Physical Therapy

## 2017-12-24 ENCOUNTER — Ambulatory Visit: Payer: BLUE CROSS/BLUE SHIELD | Attending: Neurosurgery | Admitting: Physical Therapy

## 2017-12-24 DIAGNOSIS — M5442 Lumbago with sciatica, left side: Secondary | ICD-10-CM

## 2017-12-24 DIAGNOSIS — M6281 Muscle weakness (generalized): Secondary | ICD-10-CM | POA: Insufficient documentation

## 2017-12-24 NOTE — Therapy (Addendum)
[facility] [address], [place], [place], [address] Phone: [phone]   Fax:  [phone]  Physical Therapy Treatment/Recertification/Discharge Summary  Patient Details  Name: Alison Young MRN: [id number] Date of Birth: [birth year] Referring Provider: Dr. Sherwood Gambler   Encounter Date: 12/24/2017  PT End of Session - 12/24/17 1529    Visit Number  19    Number of Visits  30    Date for PT Re-Evaluation  02/04/18    Authorization Type  30 visit limit    Authorization - Number of Visits  30    PT Start Time  0845    PT Stop Time  0925    PT Time Calculation (min)  40 min    Activity Tolerance  Patient tolerated treatment well       Past Medical History:  Diagnosis Date  . Allergic rhinitis   . Asthma   . Bundle branch block, right   . Pneumothorax     Past Surgical History:  Procedure Laterality Date  . CHEST TUBE INSERTION    . PLEURADESIS Right 09/15/2013   Procedure: PLEURADESIS with TALC APPLICATION;  Surgeon: Ivin Poot, MD;  Location: [place];  Service: Thoracic;  Laterality: Right;  Marland Kitchen VIDEO ASSISTED THORACOSCOPY Right 09/15/2013   Procedure: VIDEO ASSISTED THORACOSCOPY;  Surgeon: Ivin Poot, MD;  Location: [place];  Service: Thoracic;  Laterality: Right;  with stapling of blebs  . WRIST SURGERY     Left    There were no vitals filed for this visit.  Subjective Assessment - 12/24/17 0846    Subjective  Patient returns after several weeks.  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     Currently in Pain?  No/denies    Pain Score  0-No pain    Pain Location  Back         [facility] PT Assessment - 12/24/17 0001      Observation/Other Assessments   Focus on Therapeutic Outcomes (FOTO)   24% limitation       AROM   Lumbar Flexion  80    Lumbar Extension  32    Lumbar - Right Side Bend  45    Lumbar - Left Side Bend  50      Strength   Left Hip Extension  5/5    Left Hip ABduction  4+/5    Lumbar Flexion  4+/5    Lumbar Extension  4+/5         Planks 10 sec no pain but difficulty stabilizing          [facility] Adult PT Treatment/Exercise - 12/24/17 0001      Lumbar Exercises: Aerobic   UBE (Upper Arm Bike)  seated on green ball Level 1x 8 min (4/4)      Lumbar Exercises: Standing   Other Standing Lumbar Exercises  SLS with 3# UE movements      Lumbar Exercises: Prone   Other Prone Lumbar Exercises  planks elbows/knees 5x 15 sec hold       Lumbar Exercises: Quadruped   Other Quadruped Lumbar Exercises  1/2 kneel red band UE movements       Knee/Hip Exercises: Standing   Step Down  Right;Left;10 reps;Hand Hold: 0;Step Height: 6" with mirror feedback    Other Standing Knee Exercises  blue band around thighs side stepping and monster walks 3 laps each  PT Short Term Goals - 12/24/17 1537      PT SHORT TERM GOAL #1   Title  The patient will demonstrate understanding of basic self care, postural correction, appropriate ex    Status  Achieved      PT SHORT TERM GOAL #2   Title  The patient will report a 30% reduction in back and left LE pain    Status  Achieved      PT SHORT TERM GOAL #3   Title  The patient will have improved lumbar extension to 25 degrees and lumbar flexion to 75 degrees needed for return to [employer] duties    Status  Achieved        PT Long Term Goals - 12/24/17 1538      PT LONG TERM GOAL #1   Title  The patient will be independent in safe self progression of HEP    Time  6    Period  Weeks    Status  On-going    Target Date  02/04/18      PT LONG TERM GOAL #2   Title  The patient will report a 80% improvement in pain with work duties including bending, stooping and return to golf    Time  6    Period  Weeks    Status  Revised      PT LONG TERM GOAL #3   Title  Left hip abduction strength and core strength grossly 5-/5 needed for  return to golf    Time  6    Period  Weeks    Status  Revised      PT LONG TERM GOAL #4   Title  The patient will be able to run/walk with intervals 1 mile     Status  Achieved      PT LONG TERM GOAL #5   Title  FOTO functional outcome score improved from 59% limitation to 32% limitation indicating improved function with less pain    Status  Achieved            Plan - 12/24/17 1530    Clinical Impression Statement  The patient returns to PT after a month absence.  She reports she has been doing well without complaints of LBP.  She is concerned however with starting a new job at the [employer] where she will be playing golf with members.  She is concerned b/c the last time she went to the driving range it triggered an exacerbation of her pain.  She would like to have 2-3 more PT visits for higher level core/hip strengthening to help her make the transition to this new job successful.  Her core strength has improved significantly since her last visit.  She does have left > right gluteul weakness and considerable time today spent on specific ex's to address this.  She needs verbal cues for patellofemoral alignment.  She has met many rehab goals and anticipate she will only need 2-3 more visits max to be independent in a comprehensive HEP appropriate for her current stage in rehab.        Rehab Potential  Good    PT Frequency  Biweekly    PT Duration  6 weeks    PT Treatment/Interventions  ADLs/Self Care Home Management;Cryotherapy;Electrical Stimulation;Ultrasound;Traction;Moist Heat;Iontophoresis 92m/ml Dexamethasone;Gait training;Therapeutic activities;Therapeutic exercise;Patient/family education;Neuromuscular re-education;Manual techniques;Taping;Dry needling    PT Next Visit Plan  2-3 visits for intermediate to advanced core strengthening;  return to golf program  Patient will benefit from skilled therapeutic intervention in order to improve the following deficits and  impairments:  Pain, Decreased activity tolerance, Decreased range of motion, Decreased strength, Difficulty walking, Increased muscle spasms, Increased fascial restricitons  Visit Diagnosis: Acute bilateral low back pain with left-sided sciatica - Plan: PT plan of care cert/re-cert  Muscle weakness (generalized) - Plan: PT plan of care cert/re-cert  PHYSICAL THERAPY DISCHARGE SUMMARY  Visits from Start of Care: 19  Current functional level related to goals / functional outcomes: The patient was doing very well on last appt on 12/24/17.  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  We agreed to put her on hold status and she would call within the next month if problems arose.  She has not made contact and will assume she is doing well.  Will discharge from PT at this time with majority of goals met.     Remaining deficits: As above   Education / Equipment: Comprehensive HEP Plan: Patient agrees to discharge.  Patient goals were partially met. Patient is being discharged due to meeting the stated rehab goals.  ?????          Problem List Patient Active Problem List   Diagnosis Date Noted  . Reactive airway disease 03/26/2014  . ADHD (attention deficit hyperactivity disorder) 03/26/2014  . Rapid palpitations 04/07/2013  . Dizziness 04/07/2013  . Recurrent spontaneous pneumothorax 04/17/2011   Ruben Im, PT 12/24/17 3:42 PM Phone: [phone] Fax: [phone]  Alison Young 12/24/2017, 3:41 PM  [facility] [address] [place], [place], [address] Phone: [phone]   Fax:  [phone]  Name: Tom Ragsdale MRN: [id number] Date of Birth: [birth year]

## 2018-09-02 DIAGNOSIS — J0100 Acute maxillary sinusitis, unspecified: Secondary | ICD-10-CM | POA: Diagnosis not present

## 2018-10-18 DIAGNOSIS — Z0000 Encounter for general adult medical examination without abnormal findings: Secondary | ICD-10-CM | POA: Diagnosis not present

## 2019-06-21 ENCOUNTER — Other Ambulatory Visit: Payer: Self-pay

## 2019-06-21 DIAGNOSIS — Z20822 Contact with and (suspected) exposure to covid-19: Secondary | ICD-10-CM

## 2019-06-23 LAB — NOVEL CORONAVIRUS, NAA: SARS-CoV-2, NAA: NOT DETECTED

## 2019-07-26 ENCOUNTER — Ambulatory Visit: Payer: 59 | Attending: Internal Medicine

## 2019-07-26 DIAGNOSIS — Z20822 Contact with and (suspected) exposure to covid-19: Secondary | ICD-10-CM

## 2019-07-27 LAB — NOVEL CORONAVIRUS, NAA: SARS-CoV-2, NAA: NOT DETECTED

## 2019-08-03 ENCOUNTER — Ambulatory Visit: Payer: 59 | Attending: Internal Medicine

## 2019-08-03 DIAGNOSIS — Z20822 Contact with and (suspected) exposure to covid-19: Secondary | ICD-10-CM

## 2019-08-04 LAB — NOVEL CORONAVIRUS, NAA: SARS-CoV-2, NAA: NOT DETECTED

## 2019-08-15 ENCOUNTER — Ambulatory Visit: Payer: 59 | Attending: Internal Medicine

## 2019-08-15 DIAGNOSIS — Z20822 Contact with and (suspected) exposure to covid-19: Secondary | ICD-10-CM

## 2019-08-16 LAB — NOVEL CORONAVIRUS, NAA: SARS-CoV-2, NAA: NOT DETECTED

## 2019-08-30 ENCOUNTER — Ambulatory Visit: Payer: 59 | Attending: Internal Medicine

## 2019-08-30 DIAGNOSIS — Z20822 Contact with and (suspected) exposure to covid-19: Secondary | ICD-10-CM

## 2019-08-31 LAB — NOVEL CORONAVIRUS, NAA: SARS-CoV-2, NAA: NOT DETECTED

## 2019-09-12 ENCOUNTER — Ambulatory Visit: Payer: 59 | Attending: Internal Medicine

## 2019-09-12 DIAGNOSIS — Z20822 Contact with and (suspected) exposure to covid-19: Secondary | ICD-10-CM

## 2019-09-13 LAB — NOVEL CORONAVIRUS, NAA: SARS-CoV-2, NAA: DETECTED — AB

## 2019-10-06 ENCOUNTER — Ambulatory Visit: Payer: 59 | Attending: Internal Medicine

## 2019-10-06 DIAGNOSIS — Z23 Encounter for immunization: Secondary | ICD-10-CM

## 2019-10-06 NOTE — Progress Notes (Signed)
   Covid-19 Vaccination Clinic  Name:  Alison Young    MRN: [id number] DOB: 1990-11-05  10/06/2019  Alison Young was observed post Covid-19 immunization for 15 minutes without incident. She was provided with Vaccine Information Sheet and instruction to access the V-Safe system.   Alison Young was instructed to call 911 with any severe reactions post vaccine: Marland Kitchen Difficulty breathing  . Swelling of face and throat  . A fast heartbeat  . A bad rash all over body  . Dizziness and weakness   Immunizations Administered    Name Date Dose VIS Date Route   Pfizer COVID-19 Vaccine 10/06/2019  2:26 PM 0.3 mL 06/30/2019 Intramuscular   Manufacturer: [employer], [employer]   Lot: [id number]   NDC: [address]-4

## 2019-10-26 ENCOUNTER — Ambulatory Visit: Payer: Self-pay

## 2019-10-26 NOTE — Telephone Encounter (Signed)
Patient called stating that she filled out the phreeia questionaire and it instructed her to call and cancel her 2nd dose of COVID-19 vaccine. Per patient she was Dx positive 09/12/19. Her 2nd dose is scheduled 10/31/19. Per CDC she has completed her 10 day isolation plus some and has no symptoms. Patient was instructed to continue on 10/31/19 to have her vaccine.  She verbalized understanding and will keep her appointment.  Reason for Disposition . General information question, no triage required and triager able to answer question  Answer Assessment - Initial Assessment Questions 1. REASON FOR CALL or QUESTION: "What is your reason for calling today?" or "How can I best help you?" or "What question do you have that I can help answer?"     I did the phreesia questionaire and it told me to call you and I would have to cancel her appointment.  Protocols used: INFORMATION ONLY CALL - NO TRIAGE-A-AH

## 2019-10-31 ENCOUNTER — Ambulatory Visit: Payer: 59 | Attending: Internal Medicine

## 2019-10-31 DIAGNOSIS — Z23 Encounter for immunization: Secondary | ICD-10-CM

## 2019-10-31 NOTE — Progress Notes (Signed)
   Covid-19 Vaccination Clinic  Name:  Alison Young    MRN: [id number] DOB: 1990/10/31  10/31/2019  [name] was observed post Covid-19 immunization for 15 minutes without incident. She was provided with Vaccine Information Sheet and instruction to access the V-Safe system.   [name] was instructed to call 911 with any severe reactions post vaccine: Marland Kitchen Difficulty breathing  . Swelling of face and throat  . A fast heartbeat  . A bad rash all over body  . Dizziness and weakness   Immunizations Administered    Name Date Dose VIS Date Route   Pfizer COVID-19 Vaccine 10/31/2019  3:11 PM 0.3 mL 06/30/2019 Intramuscular   Manufacturer: [employer], [employer]   Lot: [id number]   NDC: [address]-8

## 2020-05-04 ENCOUNTER — Ambulatory Visit: Payer: 59

## 2020-05-04 NOTE — Progress Notes (Signed)
   Covid-19 Vaccination Clinic  Name:  Orine Goga    MRN: [id number] DOB: 05-Nov-1990  05/04/2020  Ms. Papaleo was observed post Covid-19 immunization for 15 minutes without incident. She was provided with Vaccine Information Sheet and instruction to access the V-Safe system.   Ms. Sulak was instructed to call 911 with any severe reactions post vaccine: Marland Kitchen Difficulty breathing  . Swelling of face and throat  . A fast heartbeat  . A bad rash all over body  . Dizziness and weakness

## 2020-11-14 ENCOUNTER — Other Ambulatory Visit (HOSPITAL_COMMUNITY): Payer: Self-pay | Admitting: Family Medicine

## 2020-11-14 DIAGNOSIS — I253 Aneurysm of heart: Secondary | ICD-10-CM

## 2020-11-26 ENCOUNTER — Ambulatory Visit (HOSPITAL_COMMUNITY): Payer: 59

## 2020-11-27 ENCOUNTER — Encounter (HOSPITAL_COMMUNITY): Payer: Self-pay

## 2020-11-27 ENCOUNTER — Ambulatory Visit (HOSPITAL_COMMUNITY): Payer: 59

## 2021-11-17 HISTORY — PX: OTHER SURGICAL HISTORY: SHX169

## 2021-12-01 ENCOUNTER — Telehealth: Payer: Self-pay

## 2021-12-01 NOTE — Telephone Encounter (Signed)
NOTES SCANNED TO REFERRAL 

## 2021-12-02 ENCOUNTER — Encounter: Payer: Self-pay | Admitting: Cardiology

## 2021-12-02 ENCOUNTER — Ambulatory Visit (INDEPENDENT_AMBULATORY_CARE_PROVIDER_SITE_OTHER): Payer: PRIVATE HEALTH INSURANCE

## 2021-12-02 ENCOUNTER — Ambulatory Visit (INDEPENDENT_AMBULATORY_CARE_PROVIDER_SITE_OTHER): Payer: PRIVATE HEALTH INSURANCE | Admitting: Cardiology

## 2021-12-02 VITALS — BP 120/80 | HR 88 | Ht 66.0 in | Wt 178.4 lb

## 2021-12-02 DIAGNOSIS — I253 Aneurysm of heart: Secondary | ICD-10-CM | POA: Insufficient documentation

## 2021-12-02 DIAGNOSIS — R002 Palpitations: Secondary | ICD-10-CM

## 2021-12-02 DIAGNOSIS — F909 Attention-deficit hyperactivity disorder, unspecified type: Secondary | ICD-10-CM | POA: Diagnosis not present

## 2021-12-02 DIAGNOSIS — R42 Dizziness and giddiness: Secondary | ICD-10-CM

## 2021-12-02 NOTE — Progress Notes (Unsigned)
Enrolled for [employer] to mail a ZIO XT long term holter monitor to the patients address on file.  

## 2021-12-02 NOTE — Assessment & Plan Note (Signed)
Really not having demand long spells of his palpitations.  Not that frequently occurring.   ? ?Zio patch monitor ?

## 2021-12-02 NOTE — Assessment & Plan Note (Addendum)
She may have some orthostatic dizziness probably not related to her palpitations.  May want to consider vestibular evaluation, but for now we will simply check in with l Zio patch monitor and echocardiogram. ?

## 2021-12-02 NOTE — Progress Notes (Signed)
? ? ?Primary Care Provider: Shirlean Mylar, MD ?[facility] Cardiologist: Bryan Lemma, MD ?Electrophysiologist: None ? ?Clinic Note: ?Chief Complaint  ?Patient presents with  ? Palpitations  ? Cardiac Valve Problem  ?  Was told she had an abnormal echocardiogram with a valve issue.  (On review echo showed atrial septal aneurysm)  ? ? ?=================================== ? ?ASSESSMENT/PLAN  ? ?Problem List Items Addressed This Visit   ? ?  ? Cardiology Problems  ? Atrial septal aneurysm (Chronic)  ?  Atrial septal aneurysm seen on initial echo.  Usually this is a benign finding, but would like to ensure that there is no other structural abnormalities especially with her having recurrent palpitations.  Would like to clarify what she was told about an abnormal finding related to one of her valves.. ? ?Plan: Check 2D echo ? ?  ?  ? Relevant Orders  ? EKG 12-Lead  ? LONG TERM MONITOR (3-14 DAYS)  ? ECHOCARDIOGRAM COMPLETE  ?  ? Other  ? ADHD (attention deficit hyperactivity disorder)  ?  She is on a stimulant which could trigger her palpitations. ? ?  ?  ? Rapid palpitations - Primary (Chronic)  ?  Really not having demand long spells of his palpitations.  Not that frequently occurring.   ? ?Zio patch monitor ? ?  ?  ? Relevant Orders  ? EKG 12-Lead  ? LONG TERM MONITOR (3-14 DAYS)  ? ECHOCARDIOGRAM COMPLETE  ? Dizziness (Chronic)  ?  She may have some orthostatic dizziness probably not related to her palpitations.  May want to consider vestibular evaluation, but for now we will simply check in with l Zio patch monitor and echocardiogram. ? ?  ?  ? ?=================================== ? ?HPI:   ? ?Alison Young is a 31 y.o. female with prior history of spontaneous pneumothorax x2 in 2012 and 2014, tachycardia (questionable SVT), ADHD, mild persistent asthma who is being seen today for the evaluation of PALPITATIONS with prior echocardiogram showing ATRIAL SEPTAL ANEURYSM.  She is being seen today at the request of  [name], FNP. ? ?Alison Young was seen by Dr. Patty Sermons back in 2014 => noted to long history of intermittent rapid tachycardia with spells lasting anywhere from 10 minutes to hours.  Usually occurring at the end of the day when she is trying to relax.  Sometimes after exercise.  Noted with some chest discomfort and dyspnea.  Apparently is strong family history of mitral prolapse in her mother and uncle. ? ?Alison Young was seen on 11/28/2021 by her PCP.  She noted overall doing well.  Having some issues with her allergies, and Wittmann insomnia.  Some indigestion taking H2 blocker at night and PPI in the morning as needed.  She also noted off-and-on palpitations few times a week lasting a few minutes at a time.  She also indicated having had an echocardiogram done in the past-she understood that this was abnormal. => As a result, she is referred back to cardiology. ? ?Recent Hospitalizations: None ? ?Reviewed  CV studies:   ? ?The following studies were reviewed today: (if available, images/films reviewed: From Epic Chart or Care Everywhere) ?TTE 04/14/2013: Normal LV size and function.  EF 55 to 60%.  No RWMA.  Normal valves.  Normal PA.  Possible atrial septal aneurysm noted. ? ? ?Interval History:  ? ?Alison Young presents here today for evaluation of palpitations.  She recanted the original evaluation back in 2013-2014 for palpitations chest pain-this was in the setting of Adderall  and caffeine.  She was told at the time that the echo showed bowel that was not really opening all the way or something.  She was then lost to follow-up because she had multiple spontaneous pneumothorax episodes at least 5 requiring surgery.  She was then dealing with asthma and anxiety issues. ? ?She does note that her mother has a history of mitral prolapse. ? ?Now she is noting that she still gets the palpitations and is associate with easy fatigability.  She describes feeling that her heart does a "flip in her chest  "that lasts a few seconds.  It only happens maybe 2-3 times a month 4 times in the most.  But that she also has these palpitations that is a little bit irregular heartbeat spells that can happen a few times a week.  Not associate with chest pain, dyspnea or leg edema.  She does have some dizziness but not associate with palpitations.   ? ?She has also had symptoms of orthostasis after prolonged standing where she gets dizzy and woozy. ? ?CV Review of Symptoms (Summary) ?Cardiovascular ROS: no chest pain or dyspnea on exertion ?positive for - irregular heartbeat, palpitations, and dizziness and wooziness, orthostatic dizziness after long periods standing.  Easy fatigue. ?negative for - chest pain, dyspnea on exertion, edema, orthopnea, paroxysmal nocturnal dyspnea, rapid heart rate, shortness of breath, or syncope or near syncope, TIA/amaurosis fugax or claudication ? ?REVIEWED OF SYSTEMS  ? ?Review of Systems  ?Constitutional:  Positive for malaise/fatigue (Easy fatigue). Negative for weight loss.  ?HENT:  Negative for congestion and nosebleeds.   ?Respiratory:  Negative for cough and shortness of breath.   ?Cardiovascular:  Negative for leg swelling.  ?     Per HPI  ?Gastrointestinal:  Positive for heartburn. Negative for blood in stool, melena, nausea and vomiting.  ?Genitourinary:  Negative for dysuria and hematuria.  ?Musculoskeletal:  Negative for joint pain and myalgias.  ?Skin:  Negative for itching.  ?Neurological:  Positive for dizziness (Orthostatic). Negative for focal weakness and weakness.  ?Psychiatric/Behavioral:  Negative for depression. The patient is nervous/anxious.   ? ?I have reviewed and (if needed) personally updated the patient's problem list, medications, allergies, past medical and surgical history, social and family history.  ? ?PAST MEDICAL HISTORY  ? ?Past Medical History:  ?Diagnosis Date  ? ADHD   ? Allergic rhinitis   ? Bundle branch block, right   ? Mild persistent asthma   ?  Pneumothorax 2012  ? Recurrent spontaneous pneumothorax-x5  ? ? ?PAST SURGICAL HISTORY  ? ?Past Surgical History:  ?Procedure Laterality Date  ? CHEST TUBE INSERTION  2012  ? For spontaneous pneumothorax  ? PLEURADESIS Right 09/15/2013  ? Procedure: PLEURADESIS with TALC APPLICATION;  Surgeon: [name], MD;  Location: [facility];  Service: Thoracic;  Laterality: Right;  ? VIDEO ASSISTED THORACOSCOPY Right 09/15/2013  ? Procedure: VIDEO ASSISTED THORACOSCOPY;  Surgeon: [name], MD;  Location: [facility] OR;  Service: Thoracic;  Laterality: Right;  with stapling of blebs  ? WRIST SURGERY Left   ? Cyst removal  ? ? ?Immunization History  ?Administered Date(s) Administered  ? Influenza Split 04/20/2011  ? PFIZER(Purple Top)SARS-COV-2 Vaccination 10/06/2019, 10/31/2019, 05/04/2020  ? ? ?MEDICATIONS/ALLERGIES  ? ?Current Meds  ?Medication Sig  ? ALPRAZolam (XANAX) 0.25 MG tablet Take 0.25 mg by mouth 2 (two) times daily.  ? drospirenone-ethinyl estradiol (YAZ) 3-0.02 MG tablet Take 1 tablet by mouth daily.  ? famotidine (PEPCID) 40 MG tablet Take  40 mg by mouth at bedtime.  ? levocetirizine (XYZAL) 5 MG tablet Take 5 mg by mouth daily.    ? montelukast (SINGULAIR) 10 MG tablet Take 10 mg by mouth daily.    ? omeprazole (PRILOSEC) 40 MG capsule Take 40 mg by mouth every morning.  ? traZODone (DESYREL) 50 MG tablet Take 50 mg by mouth daily.  ? ? ?Allergies  ?Allergen Reactions  ? Amoxicillin Other (See Comments)  ?  Makes her feel worse  ? Albuterol Palpitations  ?  ProAir/albuterol makes her have palpitations shaking and anxiety.  ? ? ?SOCIAL HISTORY/FAMILY HISTORY  ? ?Reviewed in Epic:  ? ?Social History  ? ?Tobacco Use  ? Smoking status: Never  ? Smokeless tobacco: Never  ?Substance Use Topics  ? Alcohol use: Yes  ?  Alcohol/week: 2.0 standard drinks  ?  Types: 2 Glasses of wine per week  ? Drug use: No  ? ?Social History  ? ?Social History Narrative  ? Single but in a committed relationship.  ?   ? 2 cups of half  calf coffee daily.  ? Regular healthy diet.  Walks daily.  ? ?Family History  ?Problem Relation Age of Onset  ? Allergies Mother   ? Asthma Mother   ? Heart disease Mother   ?     valve repair for MVP  ? Allergies

## 2021-12-02 NOTE — Assessment & Plan Note (Signed)
She is on a stimulant which could trigger her palpitations. ?

## 2021-12-02 NOTE — Patient Instructions (Addendum)
Medication Instructions:  ?No changes ? ? ?*If you need a refill on your cardiac medications before your next appointment, please call your pharmacy* ? ? ?Lab Work: ?Not needed ? ?Testing/Procedures: ?Will be schedule at [address]  or [place] ?Your physician has requested that you have an echocardiogram. Echocardiography is a painless test that uses sound waves to create images of your heart. It provides your doctor with information about the size and shape of your heart and how well your heart?s chambers and valves are working. This procedure takes approximately one hour. There are no restrictions for this procedure.  ? ?And ?Your physician has recommended that you wear a holter monitor 14 day . Holter monitors are medical devices that record the heart?s electrical activity. Doctors most often use these monitors to diagnose arrhythmias. Arrhythmias are problems with the speed or rhythm of the heartbeat. The monitor is a small, portable device. You can wear one while you do your normal daily activities. This is usually used to diagnose what is causing palpitations/syncope (passing out). ? ?Follow-Up: ?At [facility], you and your health needs are our priority.  As part of our continuing mission to provide you with exceptional heart care, we have created designated Provider Care Teams.  These Care Teams include your primary Cardiologist (physician) and Advanced Practice Providers (APPs -  Physician Assistants and Nurse Practitioners) who all work together to provide you with the care you need, when you need it. ? ?  ? ?Your next appointment:   ?2 month(s) ? ?The format for your next appointment:   ?In Person ? ?Provider:   ?Glenetta Hew, MD  ? ? ?Other Instructions  ?ZIO XT- Long Term Monitor Instructions ? ?Your physician has requested you wear a ZIO patch monitor for 14 days.  ?This is a single patch monitor. [employer] supplies one patch monitor per enrollment. Additional ?stickers are  not available. Please do not apply patch if you will be having a Nuclear Stress Test,  ?Echocardiogram, Cardiac CT, MRI, or Chest Xray during the period you would be wearing the  ?monitor. The patch cannot be worn during these tests. You cannot remove and re-apply the  ?ZIO XT patch monitor.  ?Your ZIO patch monitor will be mailed 3 day USPS to your address on file. It may take 3-5 days  ?to receive your monitor after you have been enrolled.  ?Once you have received your monitor, please review the enclosed instructions. Your monitor  ?has already been registered assigning a specific monitor serial # to you. ? ?Billing and Patient Assistance Program Information ? ?We have supplied [employer] with any of your insurance information on file for billing purposes. ?[employer] offers a sliding scale Patient Assistance Program for patients that do not have  ?insurance, or whose insurance does not completely cover the cost of the ZIO monitor.  ?You must apply for the Patient Assistance Program to qualify for this discounted rate.  ?To apply, please call [employer] at [phone], select option 4, select option 2, ask to apply for  ?Patient Assistance Program. Theodore Demark will ask your household income, and how many people  ?are in your household. They will quote your out-of-pocket cost based on that information.  ?[employer] will also be able to set up a 5-month, interest-free payment plan if needed. ? ?Applying the monitor ?  ?Shave hair from upper left chest.  ?Hold abrader disc by orange tab. Rub abrader in 40 strokes over the upper left chest as  ?indicated  in your monitor instructions.  ?Clean area with 4 enclosed alcohol pads. Let dry.  ?Apply patch as indicated in monitor instructions. Patch will be placed under collarbone on left  ?side of chest with arrow pointing upward.  ?Rub patch adhesive wings for 2 minutes. Remove white label marked "1". Remove the white  ?label marked "2". Rub patch adhesive wings for 2 additional minutes.   ?While looking in a mirror, press and release button in center of patch. A small green light will  ?flash 3-4 times. This will be your only indicator that the monitor has been turned on.  ?Do not shower for the first 24 hours. You may shower after the first 24 hours.  ?Press the button if you feel a symptom. You will hear a small click. Record Date, Time and  ?Symptom in the Patient Logbook.  ?When you are ready to remove the patch, follow instructions on the last 2 pages of Patient  ?Logbook. Stick patch monitor onto the last page of Patient Logbook.  ?Place Patient Logbook in the blue and white box. Use locking tab on box and tape box closed  ?securely. The blue and white box has prepaid postage on it. Please place it in the mailbox as  ?soon as possible. Your physician should have your test results approximately 7 days after the  ?monitor has been mailed back to [facility].  ?Call [facility] at [phone] if you have questions regarding  ?your ZIO XT patch monitor. Call them immediately if you see an orange light blinking on your  ?monitor.  ?If your monitor falls off in less than 4 days, contact our Monitor department at [phone].  ?If your monitor becomes loose or falls off after 4 days call [employer] at [phone] for  ?suggestions on securing your monitor  ?

## 2021-12-02 NOTE — Assessment & Plan Note (Signed)
Atrial septal aneurysm seen on initial echo.  Usually this is a benign finding, but would like to ensure that there is no other structural abnormalities especially with her having recurrent palpitations.  Would like to clarify what she was told about an abnormal finding related to one of her valves.. ? ?Plan: Check 2D echo ?

## 2021-12-04 DIAGNOSIS — R002 Palpitations: Secondary | ICD-10-CM

## 2021-12-04 DIAGNOSIS — I253 Aneurysm of heart: Secondary | ICD-10-CM | POA: Diagnosis not present

## 2021-12-16 ENCOUNTER — Other Ambulatory Visit (HOSPITAL_BASED_OUTPATIENT_CLINIC_OR_DEPARTMENT_OTHER): Payer: PRIVATE HEALTH INSURANCE

## 2021-12-18 ENCOUNTER — Ambulatory Visit (INDEPENDENT_AMBULATORY_CARE_PROVIDER_SITE_OTHER): Payer: PRIVATE HEALTH INSURANCE

## 2021-12-18 DIAGNOSIS — R002 Palpitations: Secondary | ICD-10-CM | POA: Diagnosis not present

## 2021-12-18 DIAGNOSIS — I253 Aneurysm of heart: Secondary | ICD-10-CM

## 2021-12-18 LAB — ECHOCARDIOGRAM COMPLETE
AR max vel: 2.64 cm2
AV Area VTI: 2.43 cm2
AV Area mean vel: 2.6 cm2
AV Mean grad: 3 mmHg
AV Peak grad: 5.9 mmHg
Ao pk vel: 1.21 m/s
Area-P 1/2: 4.31 cm2
Calc EF: 68.6 %
S' Lateral: 2.62 cm
Single Plane A2C EF: 71 %
Single Plane A4C EF: 67.8 %

## 2021-12-19 HISTORY — PX: TRANSTHORACIC ECHOCARDIOGRAM: SHX275

## 2022-02-17 ENCOUNTER — Encounter: Payer: Self-pay | Admitting: Cardiology

## 2022-02-17 ENCOUNTER — Ambulatory Visit (INDEPENDENT_AMBULATORY_CARE_PROVIDER_SITE_OTHER): Payer: PRIVATE HEALTH INSURANCE | Admitting: Cardiology

## 2022-02-17 DIAGNOSIS — I253 Aneurysm of heart: Secondary | ICD-10-CM

## 2022-02-17 DIAGNOSIS — R002 Palpitations: Secondary | ICD-10-CM

## 2022-02-17 DIAGNOSIS — R42 Dizziness and giddiness: Secondary | ICD-10-CM | POA: Diagnosis not present

## 2022-02-17 NOTE — Patient Instructions (Addendum)
Medication Instructions:  No changes   *If you need a refill on your cardiac medications before your next appointment, please call your pharmacy*   Lab Work: Not needed    Testing/Procedures:  Not needed  Follow-Up: At [facility], you and your health needs are our priority.  As part of our continuing mission to provide you with exceptional heart care, we have created designated Provider Care Teams.  These Care Teams include your primary Cardiologist (physician) and Advanced Practice Providers (APPs -  Physician Assistants and Nurse Practitioners) who all work together to provide you with the care you need, when you need it.     Your next appointment:   As needed  The format for your next appointment:   In Person  Provider:   Bryan Lemma, MD    Other Instructions Continue to monitor fluid hydration to ensure 7080 ounces of water a day.

## 2022-02-17 NOTE — Progress Notes (Signed)
Primary Care Provider: Shirlean MylarWebb, Carol, MD [facility] Cardiologist: Bryan Lemmaavid Salil Raineri, MD  Electrophysiologist: None  Clinic Note: Chief Complaint  Patient presents with   Follow-up    Test results: Echocardiogram and Zio patch monitor.  Stable mild palpitations and occasional dizziness.   ===================================  ASSESSMENT/PLAN   Problem List Items Addressed This Visit       Cardiology Problems   Atrial septal aneurysm (Chronic)    Seems to be relatively stable on echocardiogram.  No evidence to suspect PFO which on occasion goes along with IAS aneurysm. This would not likely be related to palpitations and is not a valvular disease. Can consider reevaluation with echocardiogram in 2 to 3 years with addition of bubble study.        Other   Dizziness (Chronic)    No arrhythmia noted on Zio patch monitor and echocardiogram relatively normal.  She may very well be will having some vestibular issues.  Consider vestibular PT if indicated.      Rapid palpitations (Chronic)    Short spells of palpitations.  Nothing notable on Zio patch monitor.  She was mostly sinus rhythm with sinus tachycardia.  Rare PACs and PVCs.  No arrhythmias noted.  If she has recurrent symptoms, could consider outpatient home monitoring with [employer] program (or [employer], [employer] Smart Watch Apps).   Continue to maintain adequate hydration and avoid triggers.     ===================================  HPI:    Alison Young is a 31 y.o. female with prior history of spontaneous pneumothorax x2 in 2012 and 2014, tachycardia (questionable SVT), ADHD, mild persistent asthma who is being seen today for follow-up evaluation of PALPITATIONS with prior echocardiogram showing ATRIAL SEPTAL ANEURYSM.  She is being seen today at the request of Shirlean MylarWebb, Carol, MD -> to discuss results of her echocardiogram and Zio patch monitor.  Alison Young was seen by Dr. Patty SermonsBrackbill back in 2014 => noted to long  history of intermittent rapid tachycardia with spells lasting anywhere from 10 minutes to hours.  Usually occurring at the end of the day when she is trying to relax.  Sometimes after exercise.  Noted with some chest discomfort and dyspnea.  Apparently is strong family history of mitral prolapse in her mother and uncle.   11/28/2021 by her PCP.  She noted overall doing well.  Having some issues with her allergies, insomnia.  Some indigestion taking H2 blocker at night and PPI in the morning as needed.  She also noted off-and-on palpitations few times a week lasting a few minutes at a time.  She also indicated having had an echocardiogram done in the past-she understood that this was abnormal. => As a result, she is referred back to cardiology.  Alison Young was seen on May 15 reconsultation to evaluate palpitations, and abnormal echocardiogram..  She recanted the original evaluation back in 2013-2014 for palpitations chest pain-this was in the setting of Adderall and caffeine.  She was told at the time that the Echo showed a valve that was not really opening all the way or something.  She was then lost to follow-up because she had multiple spontaneous pneumothorax episodes (at least 5 (requiring surgery.  She was then dealing with asthma and anxiety issues. She does note that her mother has a history of mitral prolapse.  Now she is noting that she still gets the palpitations and is associate with easy fatigability.  She describes feeling that her heart does a "flip in her chest "that lasts a few  seconds.  It only happens maybe 2-3 times a month 4 times in the most.  But that she also has these palpitations that is a little bit irregular heartbeat spells that can happen a few times a week.  Not associate with chest pain, dyspnea or leg edema.  She does have some dizziness but not associate with palpitations.    She has also had symptoms of orthostasis after prolonged standing where she gets dizzy and  woozy.  CV Review of Symptoms positive for - irregular heartbeat, palpitations, and dizziness and wooziness, orthostatic dizziness after long periods standing.  Easy fatigue. negative for - chest pain, dyspnea on exertion, edema, orthopnea, paroxysmal nocturnal dyspnea, rapid heart rate, shortness of breath, or syncope or near syncope, TIA/amaurosis fugax or claudication  Recent Hospitalizations: None  Reviewed  CV studies:    The following studies were reviewed today: (if available, images/films reviewed: From Epic Chart or Care Everywhere) TTE 12/19/21: Normal LV size and function.  EF 60 to 65%.  Normal relaxation.  No RWMA.  Normal RV size and function.  IAS bows into the right atrium with no notable shunt.  Normal MV-no MVP or MR..  Normal AOV.  Normal ascending aorta.  Normal RAP. Zio Patch: Patient had a min HR of 44 bpm, max HR of 164 bpm, and avg HR of 87 bpm. Predominant underlying rhythm was Sinus Rhythm. Isolated SVEs were rare (<1.0%), and no SVE Couplets or SVE Triplets were present. No Isolated VEs, VE Couplets, or VE Triplets were present.  No arrhythmia present.  Normal  Interval History:   Alison Young returns today for follow-up evaluation of palpitations, and to discuss results of her echocardiogram and Zio patch monitor reviewed above..   She is actually feeling relatively well.  Very happy to hear the results of both her echocardiogram and Zio patch monitor.  The IAS "aneurysm "(intra-atrial septal aneurysm--actually misnomer, simply bowing of the interatrial septum) noted on echocardiogram is consistent with previous echocardiogram.  There is no evidence to suggest shunting. She still does have some intermittent palpitations and dizziness spells but nothing prolonged.  She does get little dizzy with standing up quickly, but not necessarily associated with irregular heartbeats, palpitations or tachycardia..  She has been try to work on adequate hydration.  CV Review of  Symptoms (Summary): no chest pain or dyspnea on exertion positive for - irregular heartbeat, palpitations, and intermittent orthostatic dizziness after long periods standing.  Easy fatigue. negative for - chest pain, dyspnea on exertion, edema, orthopnea, paroxysmal nocturnal dyspnea, rapid heart rate, shortness of breath, or syncope or near syncope, TIA/amaurosis fugax or claudication  REVIEWED OF SYSTEMS   Review of Systems  Constitutional:  Positive for malaise/fatigue (Easy fatigue). Negative for weight loss.  HENT:  Negative for congestion and nosebleeds.   Respiratory:  Negative for cough and shortness of breath.   Cardiovascular:  Negative for leg swelling.       Per HPI  Gastrointestinal:  Positive for heartburn. Negative for blood in stool, melena, nausea and vomiting.  Genitourinary:  Negative for dysuria and hematuria.  Musculoskeletal:  Negative for joint pain and myalgias.  Skin:  Negative for itching.  Neurological:  Positive for dizziness (Orthostatic). Negative for focal weakness and weakness.  Psychiatric/Behavioral:  Negative for depression. The patient is nervous/anxious.     I have reviewed and (if needed) personally updated the patient's problem list, medications, allergies, past medical and surgical history, social and family history.   PAST MEDICAL HISTORY  Past Medical History:  Diagnosis Date   ADHD    Allergic rhinitis    Bundle branch block, right    Possible rate related right bundle branch block.  Not seen on EKG in May 2023   Mild persistent asthma    Pneumothorax 2012   Recurrent spontaneous pneumothorax-x5    PAST SURGICAL HISTORY   Past Surgical History:  Procedure Laterality Date   CHEST TUBE INSERTION  2012   For spontaneous pneumothorax   PLEURADESIS Right 09/15/2013   Procedure: PLEURADESIS with TALC APPLICATION;  Surgeon: [name], MD;  Location[facility] OR;  Service: Thoracic;  Laterality: Right;   TRANSTHORACIC ECHOCARDIOGRAM   12/19/2021   Normal LV size and function.  EF 60 to 65%.  Normal relaxation.  No RWMA.  Normal RV size and function.  IAS bows into the right atrium with no notable shunt.  Normal MV-no MVP or MR..  Normal AOV.  Normal ascending aorta.  Normal RAP.   VIDEO ASSISTED THORACOSCOPY Right 09/15/2013   Procedure: VIDEO ASSISTED THORACOSCOPY;  Surgeon: [name], MD;  Location: [facility] OR;  Service: Thoracic;  Laterality: Right;  with stapling of blebs   WRIST SURGERY Left    Cyst removal   Z[facility] CARDIAC MONITOR  11/2021   Patient had a min HR of 44 bpm, max HR of 164 bpm, and avg HR of 87 bpm. Predominant underlying rhythm was Sinus Rhythm. Isolated SVEs were rare (<1.0%), and no SVE Couplets or SVE Triplets were present. No Isolated VEs, VE Couplets, or VE Triplets were present.  No arrhythmia present.  Normal    Immunization History  Administered Date(s) Administered   Influenza Split 04/20/2011   PFIZER(Purple Top)SARS-COV-2 Vaccination 10/06/2019, 10/31/2019, 05/04/2020    MEDICATIONS/ALLERGIES   Current Meds  Medication Sig   ALPRAZolam (XANAX) 0.25 MG tablet Take 0.25 mg by mouth 2 (two) times daily as needed.   drospirenone-ethinyl estradiol (YAZ) 3-0.02 MG tablet Take 1 tablet by mouth daily.   famotidine (PEPCID) 40 MG tablet Take 40 mg by mouth at bedtime.   levocetirizine (XYZAL) 5 MG tablet Take 5 mg by mouth daily.     montelukast (SINGULAIR) 10 MG tablet Take 10 mg by mouth daily.     omeprazole (PRILOSEC) 40 MG capsule Take 40 mg by mouth every morning. As needed   traZODone (DESYREL) 50 MG tablet Take 50 mg by mouth daily.    Allergies  Allergen Reactions   Amoxicillin Other (See Comments)    Makes her feel worse   Albuterol Palpitations    ProAir/albuterol makes her have palpitations shaking and anxiety.    SOCIAL HISTORY/FAMILY HISTORY   Reviewed in Epic:   Social History   Tobacco Use   Smoking status: Never   Smokeless tobacco: Never  Substance Use  Topics   Alcohol use: Yes    Alcohol/week: 2.0 standard drinks of alcohol    Types: 2 Glasses of wine per week   Drug use: No   Social History   Social History Narrative   Single but in a committed relationship.      2 cups of half calf coffee daily.   Regular healthy diet.  Walks daily.   Family History  Problem Relation Age of Onset   Allergies Mother    Asthma Mother    Heart disease Mother        valve repair for MVP   Allergies Father    Skin cancer Father    Allergies Sister  Asthma Sister    Breast cancer Maternal Grandmother    Cervical cancer Maternal Grandmother    Heart disease Maternal Grandmother        Had a pacemaker placed   Heart attack Maternal Grandfather 35   Coronary artery disease Maternal Grandfather 28    OBJCTIVE -PE, EKG, labs   Wt Readings from Last 3 Encounters:  02/17/22 175 lb (79.4 kg)  12/02/21 178 lb 6.4 oz (80.9 kg)  02/15/17 134 lb (60.8 kg)    Physical Exam: BP 118/70 (BP Location: Left Arm, Patient Position: Sitting, Cuff Size: Normal)   Pulse 81   Resp 20   Ht 5\' 6"  (1.676 m)   Wt 175 lb (79.4 kg)   SpO2 98%   BMI 28.25 kg/m  Physical Exam Vitals reviewed.  Constitutional:      General: She is not in acute distress.    Appearance: Normal appearance. She is normal weight. She is not ill-appearing (Healthy-appearing.  Well-nourished and well-groomed.) or toxic-appearing (Nontoxic).  HENT:     Head: Normocephalic and atraumatic.  Neck:     Vascular: No carotid bruit.  Cardiovascular:     Rate and Rhythm: Normal rate and regular rhythm.     Pulses: Normal pulses.     Heart sounds: Normal heart sounds.     No friction rub. No gallop.     Comments: Cannot exclude soft murmur in left sternal border but also cannot exclude midsystolic click. Pulmonary:     Effort: Pulmonary effort is normal. No respiratory distress.     Breath sounds: Normal breath sounds. No wheezing, rhonchi or rales.  Chest:     Chest wall: No  tenderness.  Musculoskeletal:        General: No swelling. Normal range of motion.     Cervical back: Normal range of motion and neck supple.  Skin:    General: Skin is warm and dry.  Neurological:     General: No focal deficit present.     Mental Status: She is alert and oriented to person, place, and time.     Gait: Gait normal.  Psychiatric:        Mood and Affect: Mood normal.        Behavior: Behavior normal.        Thought Content: Thought content normal.        Judgment: Judgment normal.     Adult ECG Report  Rate: 88 ;  Rhythm: normal sinus rhythm and left atrial enlargement.  Otherwise normal axis, intervals durations. ;   Narrative Interpretation: Normal  Recent Labs: Labs not available.  Were drawn on 11/28/2021-not available at time clinic visit. No results found for: "CHOL", "HDL", "LDLCALC", "LDLDIRECT", "TRIG", "CHOLHDL" Lab Results  Component Value Date   CREATININE 0.74 09/02/2016   BUN 15 09/02/2016   NA 140 09/02/2016   K 3.6 09/02/2016   CL 103 09/02/2016   CO2 23 09/02/2016      Latest Ref Rng & Units 09/02/2016    6:43 AM 09/17/2013    3:00 AM 09/16/2013    4:00 AM  CBC  WBC 4.0 - 10.5 K/uL 11.7  5.8  9.2   Hemoglobin 12.0 - 15.0 g/dL 09/18/2013  25.8  52.7   Hematocrit 36.0 - 46.0 % 42.8  34.4  34.8   Platelets 150 - 400 K/uL 181  161  157     No results found for: "HGBA1C" Lab Results  Component Value Date   TSH 0.69 04/07/2013    ==================================================  COVID-19 Education: The signs and symptoms of COVID-19 were discussed with the patient and how to seek care for testing (follow up with PCP or arrange E-visit).    I spent a total of 21 minutes with the patient spent in direct patient consultation.  Additional time spent with chart review  / charting (studies, outside notes, etc): 12 min Total Time: 33 min  Current medicines are reviewed at length with the patient today.  (+/- concerns) none  This visit occurred  during the SARS-CoV-2 public health emergency.  Safety protocols were in place, including screening questions prior to the visit, additional usage of staff PPE, and extensive cleaning of exam room while observing appropriate contact time as indicated for disinfecting solutions.  Notice: This dictation was prepared with Dragon dictation along with smart phrase technology. Any transcriptional errors that result from this process are unintentional and may not be corrected upon review.   Studies Ordered:  No orders of the defined types were placed in this encounter.  No orders of the defined types were placed in this encounter.   Patient Instructions / Medication Changes & Studies & Tests Ordered   Patient Instructions  Medication Instructions:  No changes   *If you need a refill on your cardiac medications before your next appointment, please call your pharmacy*   Lab Work: Not needed    Testing/Procedures:  Not needed  Follow-Up: At [facility], you and your health needs are our priority.  As part of our continuing mission to provide you with exceptional heart care, we have created designated Provider Care Teams.  These Care Teams include your primary Cardiologist (physician) and Advanced Practice Providers (APPs -  Physician Assistants and Nurse Practitioners) who all work together to provide you with the care you need, when you need it.     Your next appointment:   As needed  The format for your next appointment:   In Person  Provider:   Bryan Lemma, MD    Other Instructions Continue to monitor fluid hydration to ensure 7080 ounces of water a day.     Bryan Lemma, M.D., M.S. Interventional Cardiologist   Pager # [phone] Phone # [phone] [address] [place], [place] [address]   Thank you for choosing Heartcare at [facility]!!

## 2022-03-15 ENCOUNTER — Encounter: Payer: Self-pay | Admitting: Cardiology

## 2022-03-15 NOTE — Assessment & Plan Note (Signed)
Short spells of palpitations.  Nothing notable on Zio patch monitor.  She was mostly sinus rhythm with sinus tachycardia.  Rare PACs and PVCs.  No arrhythmias noted.  If she has recurrent symptoms, could consider outpatient home monitoring with [employer] program (or [employer], [employer]).   Continue to maintain adequate hydration and avoid triggers.

## 2022-03-15 NOTE — Assessment & Plan Note (Signed)
No arrhythmia noted on Zio patch monitor and echocardiogram relatively normal.  She may very well be will having some vestibular issues.  Consider vestibular PT if indicated.

## 2022-03-15 NOTE — Assessment & Plan Note (Signed)
Seems to be relatively stable on echocardiogram.  No evidence to suspect PFO which on occasion goes along with IAS aneurysm. This would not likely be related to palpitations and is not a valvular disease. Can consider reevaluation with echocardiogram in 2 to 3 years with addition of bubble study.

## 2022-11-30 ENCOUNTER — Other Ambulatory Visit: Payer: Self-pay | Admitting: Family Medicine

## 2022-11-30 ENCOUNTER — Other Ambulatory Visit (HOSPITAL_COMMUNITY)
Admission: RE | Admit: 2022-11-30 | Discharge: 2022-11-30 | Disposition: A | Discharge status: Home / Self Care | Payer: PRIVATE HEALTH INSURANCE | Source: Ambulatory Visit | Attending: Family Medicine | Admitting: Family Medicine

## 2022-11-30 DIAGNOSIS — Z01419 Encounter for gynecological examination (general) (routine) without abnormal findings: Secondary | ICD-10-CM | POA: Insufficient documentation

## 2022-12-02 LAB — CYTOLOGY - PAP
Comment: NEGATIVE
Diagnosis: NEGATIVE
High risk HPV: NEGATIVE
